# Patient Record
Sex: Female | Born: 1970
Health system: Southern US, Community
[De-identification: ages and names within clinical notes are randomized; demographics above are authoritative.]

## PROBLEM LIST (undated history)

## (undated) DIAGNOSIS — N200 Calculus of kidney: Secondary | ICD-10-CM

## (undated) DIAGNOSIS — J45909 Unspecified asthma, uncomplicated: Secondary | ICD-10-CM

## (undated) DIAGNOSIS — I10 Essential (primary) hypertension: Secondary | ICD-10-CM

## (undated) DIAGNOSIS — T7840XA Allergy, unspecified, initial encounter: Secondary | ICD-10-CM

## (undated) DIAGNOSIS — E119 Type 2 diabetes mellitus without complications: Secondary | ICD-10-CM

## (undated) HISTORY — PX: ABDOMINAL HYSTERECTOMY: SHX81

## (undated) HISTORY — DX: Allergy, unspecified, initial encounter: T78.40XA

## (undated) HISTORY — PX: APPENDECTOMY: SHX54

## (undated) HISTORY — PX: CHOLECYSTECTOMY: SHX55

## (undated) HISTORY — DX: Calculus of kidney: N20.0

## (undated) HISTORY — DX: Essential (primary) hypertension: I10

## (undated) HISTORY — DX: Unspecified asthma, uncomplicated: J45.909

## (undated) HISTORY — PX: EXPLORATORY LAPAROTOMY: SUR591

## (undated) HISTORY — DX: Type 2 diabetes mellitus without complications: E11.9

---

## 1999-05-31 HISTORY — PX: LAPAROSCOPY: SHX197

## 2009-01-24 ENCOUNTER — Emergency Department: Payer: Self-pay | Admitting: Emergency Medicine

## 2010-06-20 ENCOUNTER — Emergency Department: Payer: Self-pay | Admitting: Emergency Medicine

## 2011-06-30 LAB — HEPATIC FUNCTION PANEL A (ARMC)
Albumin: 3.9 g/dL (ref 3.4–5.0)
Alkaline Phosphatase: 73 U/L (ref 50–136)
Bilirubin, Direct: 0.1 mg/dL (ref 0.00–0.20)
Bilirubin,Total: 0.7 mg/dL (ref 0.2–1.0)
SGOT(AST): 25 U/L (ref 15–37)
SGPT (ALT): 24 U/L
Total Protein: 9.2 g/dL — ABNORMAL HIGH (ref 6.4–8.2)

## 2011-06-30 LAB — BASIC METABOLIC PANEL
Calcium, Total: 9.4 mg/dL (ref 8.5–10.1)
Chloride: 103 mmol/L (ref 98–107)
Co2: 24 mmol/L (ref 21–32)
Creatinine: 0.87 mg/dL (ref 0.60–1.30)
EGFR (African American): >60
EGFR (Non-African Amer.): >60
Glucose: 127 mg/dL — ABNORMAL HIGH (ref 65–99)
Osmolality: 279 (ref 275–301)
Potassium: 4.2 mmol/L (ref 3.5–5.1)
Sodium: 139 mmol/L (ref 136–145)

## 2011-06-30 LAB — URINALYSIS, COMPLETE
Bacteria: NONE SEEN
Bilirubin,UR: NEGATIVE
Blood: NEGATIVE
Glucose,UR: NEGATIVE mg/dL (ref 0–75)
Ketone: NEGATIVE
Leukocyte Esterase: NEGATIVE
RBC,UR: <1 /HPF (ref 0–5)
Squamous Epithelial: 1

## 2011-06-30 LAB — CBC
HCT: 43.6 % (ref 35.0–47.0)
MCH: 27.9 pg (ref 26.0–34.0)
MCHC: 33 g/dL (ref 32.0–36.0)
MCV: 85 fL (ref 80–100)
Platelet: 369 10*3/uL (ref 150–440)
WBC: 10.7 10*3/uL (ref 3.6–11.0)

## 2011-07-01 ENCOUNTER — Observation Stay: Payer: Self-pay | Admitting: Surgery

## 2011-07-01 LAB — CBC WITH DIFFERENTIAL/PLATELET
Basophil #: 0.1 10*3/uL (ref 0.0–0.1)
Eosinophil %: 0.3 %
HCT: 39.3 % (ref 35.0–47.0)
HGB: 12.9 g/dL (ref 12.0–16.0)
MCH: 27.7 pg (ref 26.0–34.0)
MCHC: 32.7 g/dL (ref 32.0–36.0)
MCV: 85 fL (ref 80–100)
Monocyte %: 5.9 %
Neutrophil #: 11 10*3/uL — ABNORMAL HIGH (ref 1.4–6.5)
Neutrophil %: 74.9 %
Platelet: 350 10*3/uL (ref 150–440)

## 2011-07-01 LAB — COMPREHENSIVE METABOLIC PANEL
Albumin: 3.5 g/dL (ref 3.4–5.0)
Alkaline Phosphatase: 65 U/L (ref 50–136)
BUN: 8 mg/dL (ref 7–18)
Bilirubin,Total: 0.7 mg/dL (ref 0.2–1.0)
Co2: 25 mmol/L (ref 21–32)
Creatinine: 0.76 mg/dL (ref 0.60–1.30)
EGFR (Non-African Amer.): >60
Osmolality: 275 (ref 275–301)
SGOT(AST): 19 U/L (ref 15–37)
SGPT (ALT): 21 U/L
Sodium: 138 mmol/L (ref 136–145)
Total Protein: 8 g/dL (ref 6.4–8.2)

## 2011-07-02 LAB — URINE CULTURE

## 2011-07-04 LAB — PATHOLOGY REPORT

## 2013-01-14 IMAGING — CT CT ABD-PELV W/ CM
1 of 2 series · 15 of 32 positions shown, 19 images · non-contrast
Comparison: none

REASON FOR EXAM: (1) RUQ and flank pain renal stone v gallbladder
disease; (2) ain renal stone v
COMMENTS:

PROCEDURE:     CT  - CT ABDOMEN / PELVIS  W  - June 30, 2011  [DATE]
RESULT:     CT abdomen and pelvis dated 06/30/2011. This study was compared
to a prior noncontrasted renal colic CT from 06/20/2010.
TECHNIQUE: Helical 3 mm sections were obtained from the lung bases through
the pubic symphysis status post intravenous administration of 100 mL of
3sovue-2IW.

[Series 2: 3mm soft tissue · axial · 0.83mm/px · z∈[-1065,-603]mm · 15 of 170 slices shown, 19 images]
[im 8/170  soft-tissue]
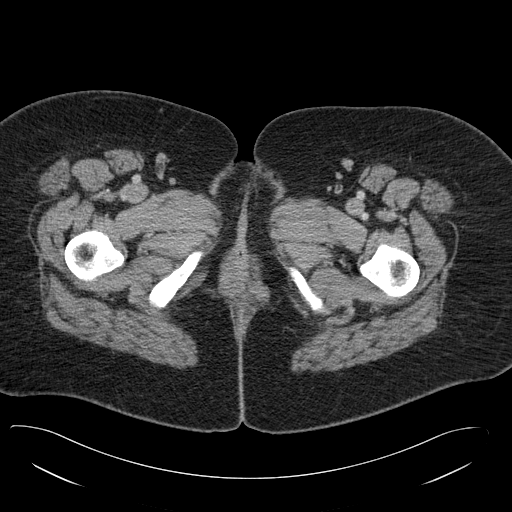
[im 8/170  bone]
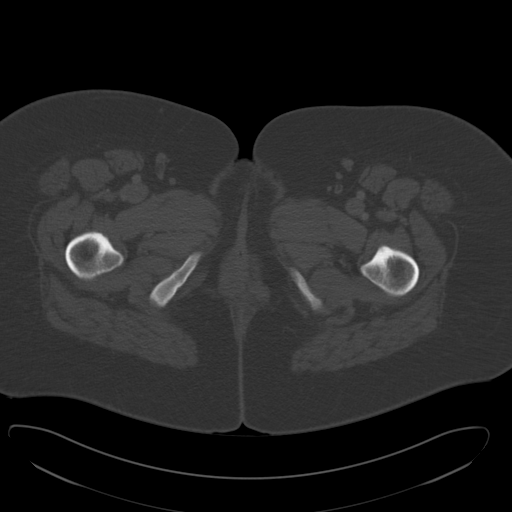
[im 22/170  soft-tissue]
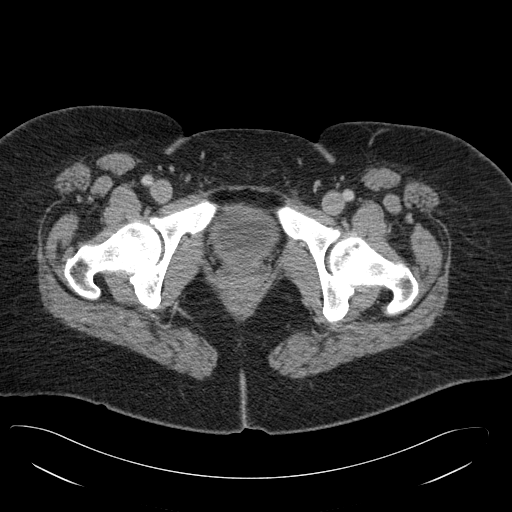
[im 36/170  soft-tissue]
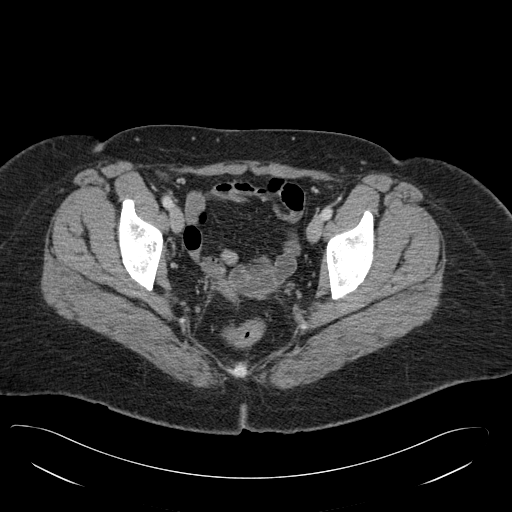
[im 50/170  soft-tissue]
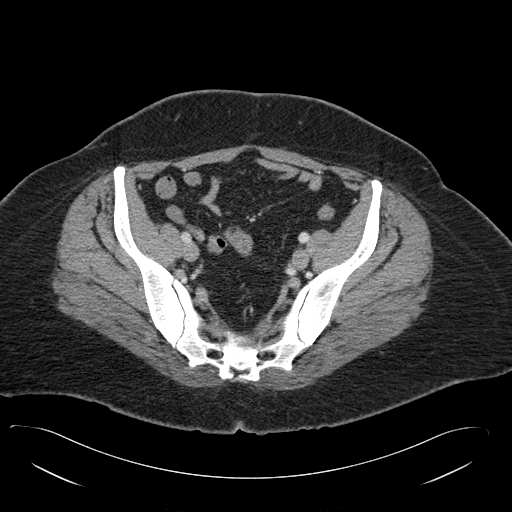
[im 57/170  soft-tissue]
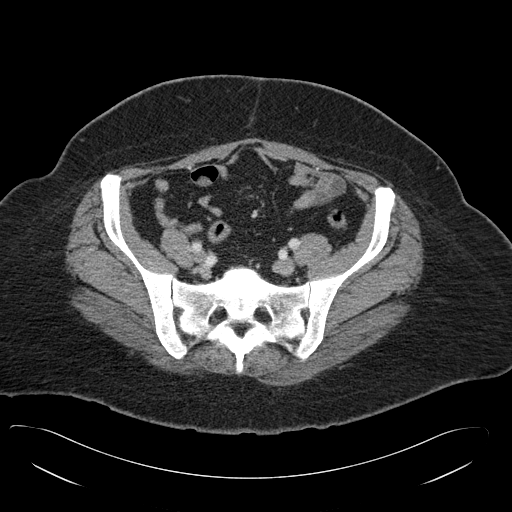
[im 71/170  soft-tissue]
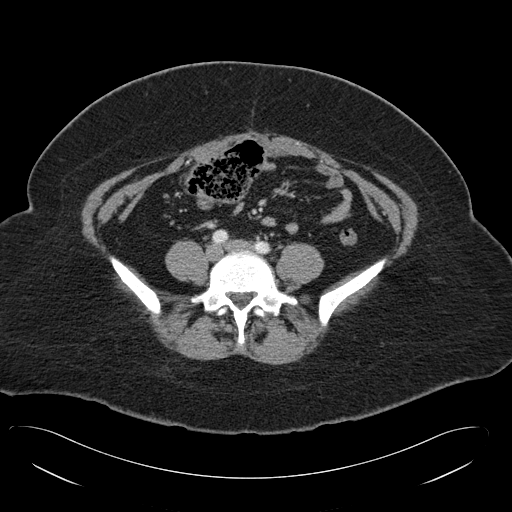
[im 85/170  soft-tissue]
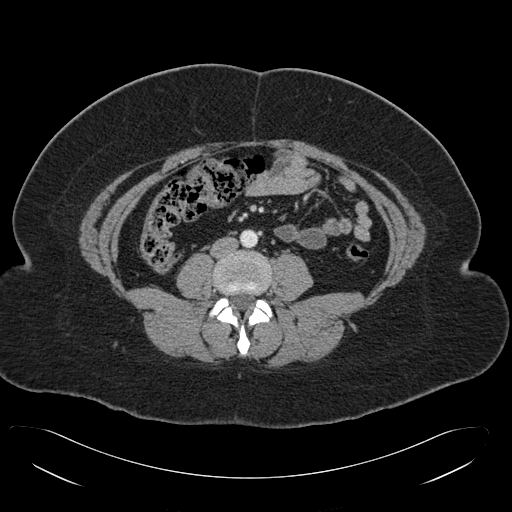
[im 99/170  soft-tissue]
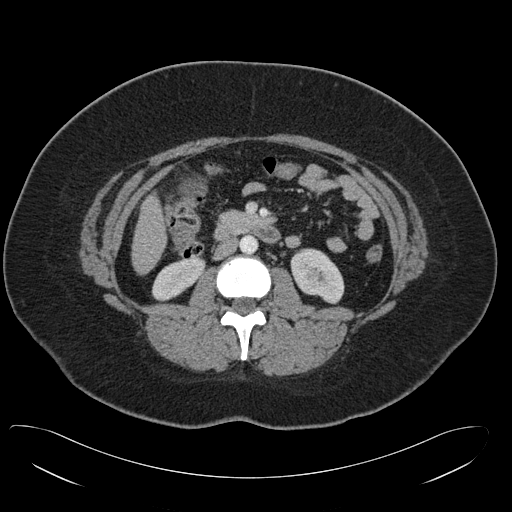
[im 113/170  soft-tissue]
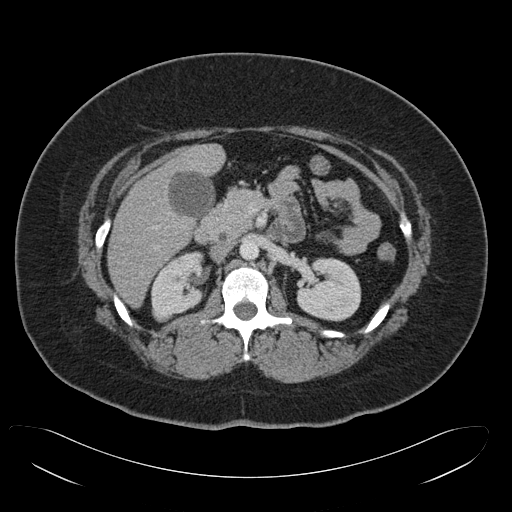
[im 113/170  bone]
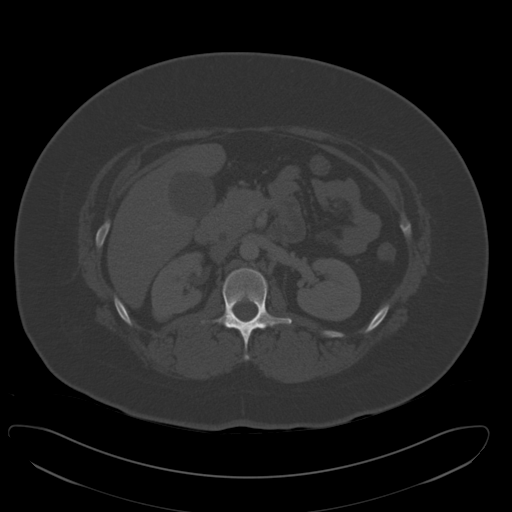
[im 120/170  soft-tissue]
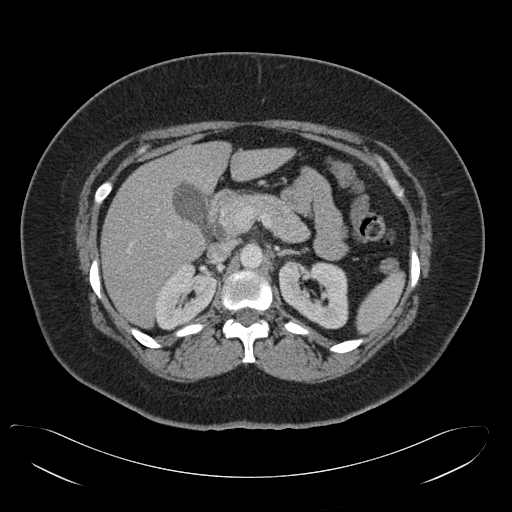
[im 134/170  soft-tissue]
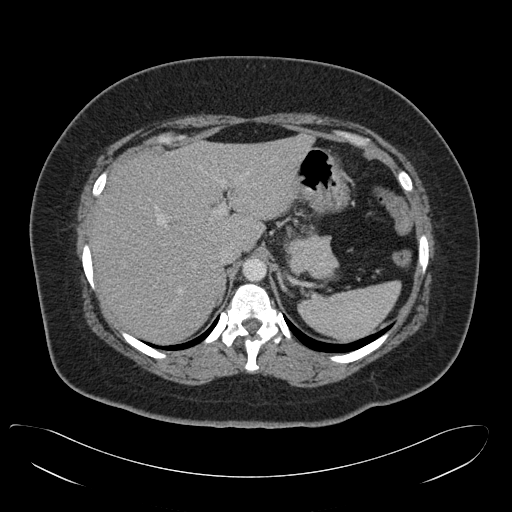
[im 141/170  lung]
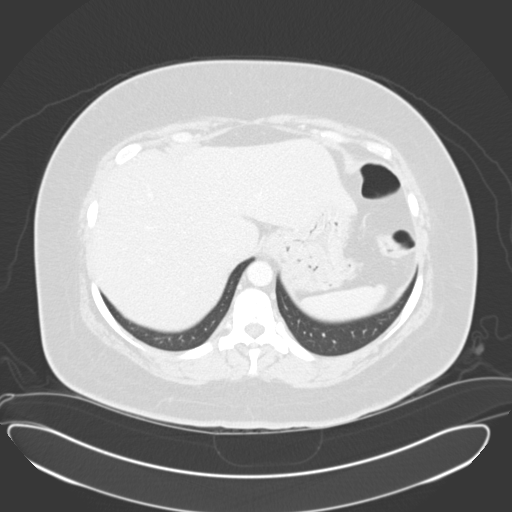
[im 148/170  soft-tissue]
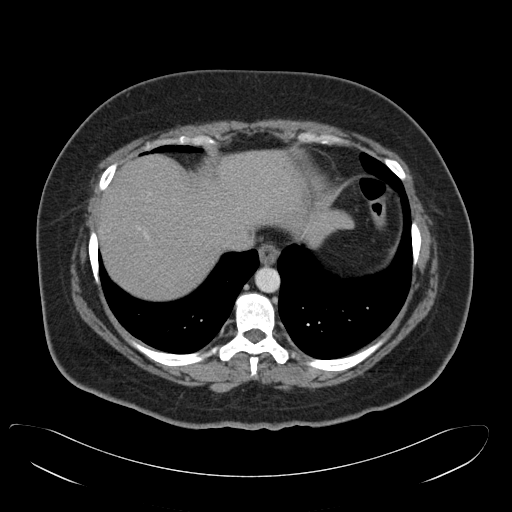
[im 148/170  lung]
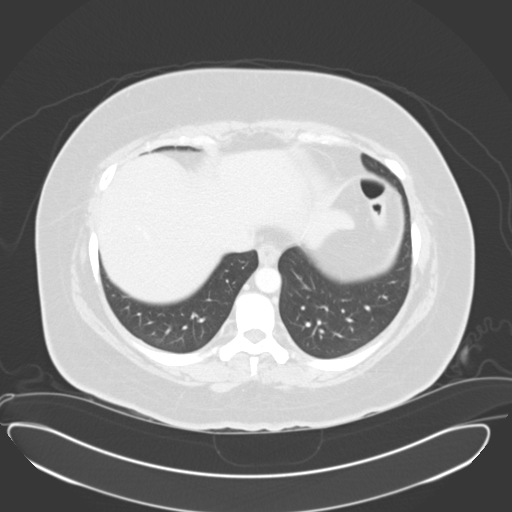
[im 155/170  lung]
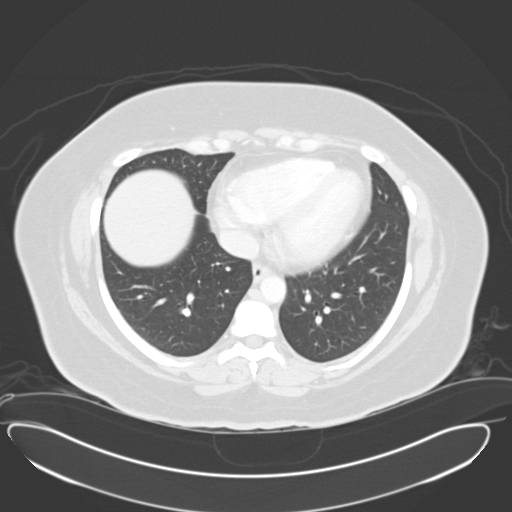
[im 162/170  soft-tissue]
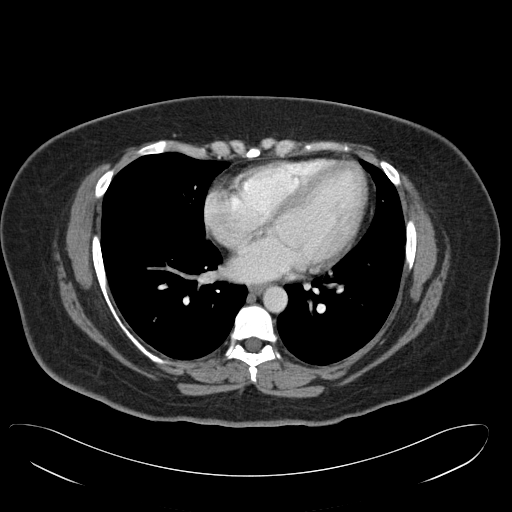
[im 162/170  lung]
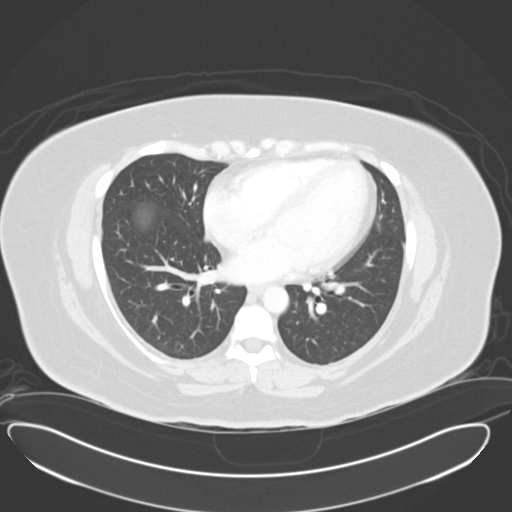

[15 of 32 positions shown; findings below may reference images not displayed]

FINDINGS: The lung bases are unremarkable.

The liver is unremarkable.

The gallbladder is distended with a small amount of surrounding
pericholecystic fluid. A trace amount of stranding is appreciated within the
pericholecystic fat along the base the gallbladder. No definite calcified
gallstones are appreciated. The common bile duct is dilated at 1.09 cm.

The pancreas, adrenals, right kidney unremarkable. 3.3 mm medullary calculus
is identified within the left kidney.

There is no CT evidence of bowel obstruction nor secondary signs reflecting
enteritis, colitis, diverticulitis nor appendicitis. There is no evidence of
abdominal or pelvic drainable loculated fluid collections masses or
adenopathy. There is no evidence of abdominal aortic aneurysm.
IMPRESSION: CT findings concerning for early or mild cholecystitis
clinical correlation recommended.
2. No further focal or acute abnormalities.

## 2014-09-21 NOTE — Discharge Summary (Signed)
PATIENT NAME:  Teresa SmilesFOUST, Liani D MR#:  [id number] DATE OF BIRTH:  09/14/1970  DATE OF ADMISSION:  06/30/2011 DATE OF DISCHARGE:  07/02/2011  BRIEF HISTORY: Marcelyn DittyLatanya Hulsey is a 44 year old woman seen in the Emergency Room with evidence consistent with acute biliary tract disease. She was admitted with 24-hour history of abdominal pain primarily midepigastric and right upper quadrant pain. Laboratory values were unremarkable. CT scan performed in the Emergency Room demonstrated cholecystitis findings and evidence of gallstones. There did not appear to be any evidence of other intraabdominal process. She was placed on IV antibiotics and reevaluated the following morning. Her white blood cell count had risen to 14,000 so we felt that surgical intervention was indicated. After appropriate preoperative preparation and informed consent, she was taken to surgery on the morning of 07/01/2011 where she underwent a laparoscopic cholecystectomy with cholangiography. The procedure was uncomplicated. She had no significant intraoperative problems, but she did have evidence of severe biliary tract disease with acute cholecystitis. She was observed over the course of the evening. This morning she is up, active, and tolerating a diet with no complaints. Her wounds look good. There is no sign of any infection. We will discharge her home today to be followed in the office in 7 to 10 days' time. She is to  take Vicodin for pain and resume her home medications which include ibuprofen 800 mg three times daily p.r.n.   FINAL DISCHARGE DIAGNOSIS: Acute cholecystitis.   PROCEDURE: Laparoscopic cholecystectomy with cholangiography. ____________________________ Carmie Endalph L. Ely III, MD rle:slb D: 07/02/2011 10:25:54 ET T: 07/03/2011 14:56:49 ET JOB#: [id number]  cc: Carmie Endalph L. Ely III, MD, <Dictator> Reginold AgentEugene H. Thurmond ButtsWade, MD Quentin OreALPH L ELY MD ELECTRONICALLY SIGNED 07/10/2011 21:14

## 2014-09-21 NOTE — Op Note (Signed)
PATIENT NAME:  Teresa SmilesFOUST, Ronnika D MR#:  [id number] DATE OF BIRTH:  1970-12-13  DATE OF PROCEDURE:  07/01/2011  PREOPERATIVE DIAGNOSIS: Acute cholecystitis.   POSTOPERATIVE DIAGNOSIS: Acute cholecystitis.   SURGERY: Laparoscopic cholecystectomy with cholangiography.   SURGEON: Quentin Orealph L. Ely, III, MD   ANESTHESIA: General.   OPERATIVE PROCEDURE: With the patient in the supine position after the induction of appropriate general anesthesia, the patient's abdomen was prepped with ChloraPrep and draped with sterile towels. The patient was placed in the head down, feet up position. A small infraumbilical incision was made in the standard fashion, carried down bluntly through the subcutaneous tissue. The Veress was used to cannulate the peritoneal cavity. CO2 was insufflated to appropriate pressure measurements. When approximately 2.5 liters of CO2 were instilled, the Veress needle was withdrawn. An 11 mm [employer] port was inserted into the peritoneal cavity. Intraperitoneal position was confirmed and CO2 was reinsufflated. The patient was placed in the head up, feet down position and rotated slightly to the left side. A subxiphoid transverse incision was made and an 11 mm port inserted under direct vision. Two lateral ports 5 mm in size were inserted under direct vision. The gallbladder was densely adherent to the omentum, and multiple adhesions were taken down over the gallbladder. It was quite distended and could not be grasped easily. It was drained of 45 mL of dark-colored bile. The omentum was taken off the gallbladder with difficulty, and the gallbladder appeared to be acutely inflamed. The gallbladder was retracted superiorly and laterally exposing the hepatoduodenal ligament. The cystic artery and cystic duct were identified. The cystic artery was doubly clipped and divided just above the lymph node. The cystic duct was easily visualized, clipped on the gallbladder side and opened. An on table  cholangiogram using dynamic fluoroscopy revealed free flow of dye into the duodenum. The hepatic radicles were seen. The duct was slightly dilated. The catheter was withdrawn, and the cystic duct was doubly clipped on the common duct side and divided. The gallbladder was then dissected free from its bed near the liver using hook and cautery apparatus. The gallbladder was quite edematous. Dissection was very difficult. Once the gallbladder was free, the camera was switched to the subxiphoid port and the umbilical port was reviewed. There did not appear to be any evidence of bowel or abdominal wall injury. No vascular injury was identified. An EndoCatch bag was used to capture the gallbladder, and it was removed through the umbilicus. The umbilical incision had to be slightly enlarged to accommodate the gallbladder. The abdomen was then copiously irrigated and suctioned. The abdomen was desufflated. All ports were withdrawn without difficulty. The midline fascia was closed with figure-of-eight sutures of 0 Vicryl. The skin was closed with 5-0 nylon. The area was infiltrated with 0.25% Marcaine for postoperative pain control. Sterile dressings were applied. The patient was returned to the recovery room having tolerated the procedure well. Sponge, instrument, and needle counts were correct x2 in the Operating Room.  ____________________________ Quentin Orealph L. Ely III, MD rle:cbb D: 07/01/2011 12:25:15 ET T: 07/01/2011 14:32:08 ET JOB#: [id number]  cc: Quentin Orealph L. Ely III, MD, <Dictator> Quentin OreALPH L ELY MD ELECTRONICALLY SIGNED 07/10/2011 21:13

## 2014-09-21 NOTE — H&P (Signed)
Subjective/Chief Complaint 14 hours of upper abdominal pain, nausea and vomiting.    History of Present Illness 44 y/o female with history of renal stones presents with 14 hours of constant upper abdominal pain with radiation into her upper middle back in a band-like distribution.  No fevers, no previous symptoms like this.  CT and US shows distended gallbladder and stones in GB neck, CBD about 10 mm.  no fevers, no jaundice.    Past History endometriosis, TAH/BSO Appendectomy Obesity   Past Med/Surgical Hx:  appendectomy:   hysterectomy:   ALLERGIES:  NKA: None    Other Allergies none   HOME MEDICATIONS:  ibuprofen 800 mg tablet: 1 tab(s) orally 3 times a day x 7 days as needed , Active  acetaminophen-HYDROcodone 500 mg-5 mg tablet: 2 tab(s) orally every 4 hours as needed  , Active  Family and Social History:   Family History Non-Contributory    Social History negative tobacco, negative ETOH, negative Illicit drugs    Place of Living Home  employed.   Review of Systems:   Subjective/Chief Complaint see above.    Abdominal Pain Yes    Nausea/Vomiting Yes    Tolerating Diet No  Nauseated  Vomiting    Medications/Allergies Reviewed Medications/Allergies reviewed   Physical Exam:   GEN NAD, obese, disheveled, temp 98.4 p 78 bp 208/118    HEENT pale conjunctivae    NECK No masses    CARD regular rate  no murmur    ABD positive tenderness  no hernia  midline scar, no hernia, positive rovsings's sign.    LYMPH negative neck    EXTR negative cyanosis/clubbing    SKIN normal to palpation, No rashes, No ulcers    NEURO cranial nerves intact    PSYCH A+O to time, place, person   Routine Chem:  31-Jan-13 14:19    Glucose, Serum 127   BUN 12   Creatinine (comp) 0.87   Sodium, Serum 139   Potassium, Serum 4.2   Chloride, Serum 103   CO2, Serum 24   Calcium (Total), Serum 9.4   Anion Gap 12   Osmolality (calc) 279   eGFR (African American) >60   eGFR  (Non-African American) >60  Routine Hem:  31-Jan-13 14:19    WBC (CBC) 10.7   RBC (CBC) 5.16   Hemoglobin (CBC) 14.4   Hematocrit (CBC) 43.6   Platelet Count (CBC) 369   MCV 85   MCH 27.9   MCHC 33.0   RDW 13.4  Hepatic:  31-Jan-13 14:19    Bilirubin, Total 0.7   Bilirubin, Direct 0.1   Alkaline Phosphatase 73   SGPT (ALT) 24   SGOT (AST) 25   Total Protein, Serum 9.2   Albumin, Serum 3.9  Routine UA:  31-Jan-13 14:20    Color (UA) Yellow   Clarity (UA) Clear   Glucose (UA) Negative   Bilirubin (UA) Negative   Ketones (UA) Negative   Specific Gravity (UA) 1.019   Blood (UA) Negative   pH (UA) 7.0   Protein (UA) Negative   Nitrite (UA) Negative   Leukocyte Esterase (UA) Negative   RBC (UA) <1 /HPF   WBC (UA) <1 /HPF   Epithelial Cells (UA) 1 /HPF   Mucous (UA) PRESENT   Radiology Results: [place]:    31-Jan-13 18:14, US Abdomen Limited Survey   US Abdomen Limited Survey   REASON FOR EXAM:    RUQ pain; eval gallbladder  COMMENTS:   Body Site: Appendix/Bowel  PROCEDURE: [place]  - US ABDOMEN LIMITED SURVEY  - Jun 30 2011  6:14PM     RESULT: Right upper quadrant ultrasound dated 06/30/2011.    Technique: Real-time sonographic imaging of the right upper quadrant was   obtained. 27 images were provided.    Findings: Evaluation of the gallbladder demonstrates 2 calculi within the   gallbladder. The largest is nonmobile appears be lodged within the neck   of the gallbladder measuring 3.06 cm in diameter. A second calculus is   appreciated measuring 2.06 cm in diameter. The patient is not demonstrate   a sonographic Murphy's sign. Is no evidence of gallbladder wall     thickening at 1.4 mm. No sonographic evidence of pericholecystic fluid is   identified. The common bile duct is dilated at 9 mm. The pancreatic head   is most unremarkable.    IMPRESSION:  Gallstone appears be lodged within the neck of the   gallbladder. There is dilatation of the common bile duct. No  further   sonographic evidence of cholecystitis. Clinical correlation recommended.          Verified By: Mikki Santee, M.D., MD  CT:    31-Jan-13 17:09, CT Abdomen and Pelvis With Contrast   CT Abdomen and Pelvis With Contrast   REASON FOR EXAM:    (1) RUQ and flank pain renal stone v gallbladder   disease; (2) ain renal stone v  COMMENTS:       PROCEDURE: CT  - CT ABDOMEN / PELVIS  W  - Jun 30 2011  5:09PM     RESULT: CT abdomen and pelvis dated 06/30/2011. This study was compared to   a prior noncontrasted renal colic CT from 4/94/4967.    Technique: Helical 3 mm sections were obtained from the lung bases   through the pubic symphysis status post intravenous administration of 100   mL of Isovue-370.    Findings: The lung bases are unremarkable.    The liver is unremarkable.    The gallbladder is distended with a small amount of surrounding   pericholecystic fluid. A trace amount of stranding is appreciated within   the pericholecystic fat along the base the gallbladder. No definite   calcified gallstones are appreciated. The common bile duct is dilated at   1.09 cm.    The pancreas, adrenals, right kidney unremarkable. 3.3 mm medullary   calculus is identified within the left kidney.    There is no CT evidence of bowel obstruction nor secondary signs   reflecting enteritis, colitis, diverticulitis nor appendicitis. There is   no evidence of abdominal or pelvic drainable loculated fluid collections   masses or adenopathy. There is no evidence of abdominal aortic aneurysm.  IMPRESSION:  CT findings concerning for early or mild cholecystitis   clinical correlation recommended.  2. No further focal or acute abnormalities.          Verified By: Mikki Santee, M.D., MD     Assessment/Admission Diagnosis 44 y/o female with acute calculus cholecystitis, obesity, ? undiagnosed hypertension.?    Plan Admit, IV fluids, pain meds, anti-emetics and plan for laparoscopic  cholecsytectomy with cholangiogram in am.  discussed procedure with patient, including risk of bleeding, bile duct injury, need for open operation.  Hypertension will recheck BP once pain better controlled.  total time spent 45 minutes  I personally reviewed CT scan and ultrasound on PACS monitor.   Electronic Signatures: Sherri Rad (MD)  (Signed 31-Jan-13 22:01)  Authored: CHIEF  COMPLAINT and HISTORY, PAST MEDICAL/SURGIAL HISTORY, ALLERGIES, Other Allergies, HOME MEDICATIONS, FAMILY AND SOCIAL HISTORY, REVIEW OF SYSTEMS, PHYSICAL EXAM, LABS, Radiology, ASSESSMENT AND PLAN   Last Updated: 31-Jan-13 22:01 by Sherri Rad (MD)

## 2014-09-21 NOTE — H&P (Signed)
PATIENT NAME:  Teresa SmilesFOUST, Leasia D MR#:  [id number] DATE OF BIRTH:  16-Dec-1970  DATE OF ADMISSION:  06/30/2011  ADMITTING DIAGNOSIS: Acute calculus cholecystitis.   HISTORY: This is a 44 year old white female with a remote history of significant endometriosis requiring laparotomy and total abdominal hysterectomy and bilateral salpingo-oophorectomy at the age of 44 who presented to the Emergency Room with a 14 hour history of sudden onset of epigastric and right upper quadrant abdominal pain associated in a bandlike orientation and distribution in her upper abdomen with extension into her mid back. The patient has had nausea and vomiting. No fevers, no previous symptoms of such. She does have a history of kidney stones, which she has passed in the past and she states that this pain is separate from that. Of note, she has also had an appendectomy. She has had no pregnancies. The patient had some narcotics while in the Emergency Room and feels somewhat better. She is hesitant to eat. Denies any family history of cholelithiasis. Again no previous symptoms of such. She has had no fever and no jaundice. While in the Emergency Room, a CT scan was obtained which demonstrated some stranding in the right upper quadrant around the area of the gallbladder. The common bile duct was dilated at greater than 10 mm. An ultrasound was obtained which demonstrated two large stones, one of which was lodged in the neck of the gallbladder and bile duct measuring greater than 10 mm. Surgical services were consulted.  ALLERGIES: None.   MEDICATIONS: None.   PAST MEDICAL HISTORY: Endometriosis.   PAST SURGICAL HISTORY: Total abdominal hysterectomy with bilateral salpingo-oophorectomy and adhesiolysis at the age of 44, appendectomy.   FAMILY HISTORY: Noncontributory.   SOCIAL HISTORY: She is employed and not married. Does not drink. Does not smoke. No illicit drugs. She lives at her home and is employed by an [employer].    REVIEW OF SYSTEMS: As described above and as per ten-point review, otherwise unremarkable.   PHYSICAL EXAMINATION:  VITAL SIGNS: Temperature 98.4, pulse 78, blood pressure is 208/18. She is obese in no apparent distress but somewhat disheveled.   LUNGS: Clear bilaterally.   HEART: Regular rate and rhythm.   ABDOMEN: Tender in the right upper quadrant and mid epigastrium with Murphy's sign present. There is a lower midline scar with no obvious hernias.   HEENT: Facies are symmetrical.   NEUROLOGIC: Grossly intact.   PSYCHIATRIC: Appropriate mood, judgment, and affect.   LABORATORY VALUES: Liver function tests are normal. Glucose 127, BUN 12, creatinine 0.87, sodium 139, potassium 4.2, total bilirubin 0.7, alkaline phosphatase 73, ALT 24, AST 25, serum albumin 3.2, white count 10.7, hemoglobin 14.4, hematocrit 43.6, platelet count 369,000.   Review of CT scan and ultrasound is as described above.   IMPRESSION: Acute calculus cholecystitis and dilated common bile duct. There is no biochemical signs of choledocholithiasis.   PLAN: The patient will be admitted, hydrated, administered narcotics and antiemetics. We will plan for laparoscopic cholecystectomy with intraoperative cholangiography in the morning. I discussed with her the risks of surgery including that of bleeding, infection, need for conversion to open  operation, bile duct injury, and possibility of needing future ERCP. All of her questions were answered.    TOTAL TIME SPENT: 60 minutes.   ____________________________ Redge GainerMark A. Egbert GaribaldiBird, MD mab:rbg D: 06/30/2011 23:46:50 ET T: 07/01/2011 08:31:12 ET JOB#: [id number]  cc: Loraine LericheMark A. Egbert GaribaldiBird, MD, <Dictator> Reginold AgentEugene H. Thurmond ButtsWade, MD Chelesa Weingartner Kela MillinA Metztli Sachdev MD ELECTRONICALLY SIGNED 07/01/2011 11:22

## 2015-02-25 ENCOUNTER — Emergency Department: Payer: Self-pay

## 2015-02-25 ENCOUNTER — Encounter: Payer: Self-pay | Admitting: *Deleted

## 2015-02-25 ENCOUNTER — Emergency Department
Admission: EM | Admit: 2015-02-25 | Discharge: 2015-02-25 | Disposition: A | Discharge status: Home / Self Care | Payer: Self-pay | Attending: Emergency Medicine | Admitting: Emergency Medicine

## 2015-02-25 DIAGNOSIS — J069 Acute upper respiratory infection, unspecified: Secondary | ICD-10-CM | POA: Insufficient documentation

## 2015-02-25 MED ORDER — IPRATROPIUM-ALBUTEROL 0.5-2.5 (3) MG/3ML IN SOLN
3.0000 mL | Freq: Once | RESPIRATORY_TRACT | Status: AC
  Administered 2015-02-25: 3 mL via RESPIRATORY_TRACT

## 2015-02-25 MED ORDER — AZITHROMYCIN 250 MG PO TABS: 500.0000 mg | ORAL_TABLET | Freq: Every day | ORAL | Status: AC

## 2015-02-25 MED ORDER — BENZONATATE 100 MG PO CAPS
200.0000 mg | ORAL_CAPSULE | Freq: Once | ORAL | Status: AC
  Administered 2015-02-25: 200 mg via ORAL
  Filled 2015-02-25: qty 2

## 2015-02-25 MED ORDER — BENZONATATE 100 MG PO CAPS: 100.0000 mg | ORAL_CAPSULE | Freq: Four times a day (QID) | ORAL | Status: AC | PRN

## 2015-02-25 MED ORDER — AZITHROMYCIN 250 MG PO TABS
500.0000 mg | ORAL_TABLET | Freq: Once | ORAL | Status: AC
  Administered 2015-02-25: 500 mg via ORAL
  Filled 2015-02-25: qty 2

## 2015-02-25 MED ORDER — IPRATROPIUM-ALBUTEROL 0.5-2.5 (3) MG/3ML IN SOLN
RESPIRATORY_TRACT | Status: AC
  Filled 2015-02-25: qty 3

## 2015-02-25 NOTE — ED Notes (Signed)
Breathing treatment given in triage.  Pt states she is breathing better.

## 2015-02-25 NOTE — ED Provider Notes (Signed)
[facility] Emergency Department Provider Note  ____________________________________________  Time seen: 5:10 AM  I have reviewed the triage vital signs and the nursing notes.   HISTORY  Chief Complaint Cough and Nasal Congestion      HPI Teresa Simpson is a 44 y.o. female presents with nasal congestion cough 2 days. Patient states she took over-the-counter Benadryl without relief. Patient denies any fever. Patient admits to previous episodes of sinusitis however denies any history of pneumonia.     Past medical history Sinusitis There are no active problems to display for this patient.   Past surgical history None No current outpatient prescriptions on file.  Allergies No known drug allergies No family history on file.  Social History Social History  Substance Use Topics  . Smoking status: Never Smoker   . Smokeless tobacco: Not on file  . Alcohol Use: No    Review of Systems  Constitutional: Negative for fever. Eyes: Negative for visual changes. ENT: Negative for sore throat. Positive for nasal congestion Cardiovascular: Negative for chest pain. Respiratory: Negative for shortness of breath. Positive for cough Gastrointestinal: Negative for abdominal pain, vomiting and diarrhea. Genitourinary: Negative for dysuria. Musculoskeletal: Negative for back pain. Skin: Negative for rash. Neurological: Negative for headaches, focal weakness or numbness.   10-point ROS otherwise negative.  ____________________________________________   PHYSICAL EXAM:  VITAL SIGNS: ED Triage Vitals  Enc Vitals Group     BP 02/25/15 0123 139/113 mmHg     Pulse Rate 02/25/15 0123 120     Resp 02/25/15 0123 22     Temp 02/25/15 0123 98.7 F (37.1 C)     Temp Source 02/25/15 0123 Oral     SpO2 02/25/15 0123 99 %     Weight 02/25/15 0123 285 lb (129.275 kg)     Height 02/25/15 0123  (1.778 m)     Head Cir --      Peak Flow --      Pain  Score --      Pain Loc --      Pain Edu? --      Excl. in GC? --      Constitutional: Alert and oriented. Well appearing and in no distress. Eyes: Conjunctivae are normal. PERRL. Normal extraocular movements. ENT   Head: Normocephalic and atraumatic.   Nose: No congestion/rhinnorhea.   Mouth/Throat: Mucous membranes are moist.   Neck: No stridor. Cardiovascular: Normal rate, regular rhythm. Normal and symmetric distal pulses are present in all extremities. No murmurs, rubs, or gallops. Respiratory: Normal respiratory effort without tachypnea nor retractions. Breath sounds are clear and equal bilaterally. No wheezes/rales/rhonchi. Gastrointestinal: Soft and nontender. No distention. There is no CVA tenderness. Genitourinary: deferred Musculoskeletal: Nontender with normal range of motion in all extremities. No joint effusions.  No lower extremity tenderness nor edema. Neurologic:  Normal speech and language. No gross focal neurologic deficits are appreciated. Speech is normal.  Skin:  Skin is warm, dry and intact. No rash noted. Psychiatric: Mood and affect are normal. Speech and behavior are normal. Patient exhibits appropriate insight and judgment.  ____________________________________________    LABS (pertinent positives/negatives)   RADIOLOGY   DG Chest 2 View (Final result) Result time: 02/25/15 05:52:06   Final result by Rad Results In Interface (02/25/15 05:52:06)   Narrative:   CLINICAL DATA: Initial evaluation for acute cough, fever.  EXAM: CHEST 2 VIEW  COMPARISON: None.  FINDINGS: Transverse heart size at the upper limits of normal. Mediastinal silhouette within normal  limits.  Lungs are hypoinflated. Minimal linear opacity along the left hemidiaphragm most consistent with subsegmental atelectasis. No focal infiltrates identified no pulmonary edema or pleural effusion. No pneumothorax.  No acute osseus abnormality.  IMPRESSION: Shallow  lung inflation with mild left basilar subsegmental atelectasis. No other active cardiopulmonary disease.   Electronically Signed By: Rise Mu M.D. On: 02/25/2015 05:52      INITIAL IMPRESSION / ASSESSMENT AND PLAN / ED COURSE  Pertinent labs & imaging results that were available during my care of the patient were reviewed by me and considered in my medical decision making (see chart for details).  History physical exam consistent with upper respiratory tract infection.  ____________________________________________   FINAL CLINICAL IMPRESSION(S) / ED DIAGNOSES  Final diagnoses:  Upper respiratory tract infection      Darci Current, MD 02/25/15 [phone]

## 2015-02-25 NOTE — Discharge Instructions (Signed)
Upper Respiratory Infection, Adult An upper respiratory infection (URI) is also sometimes known as the common cold. The upper respiratory tract includes the nose, sinuses, throat, trachea, and bronchi. Bronchi are the airways leading to the lungs. Most people improve within 1 week, but symptoms can last up to 2 weeks. A residual cough may last even longer.  CAUSES Many different viruses can infect the tissues lining the upper respiratory tract. The tissues become irritated and inflamed and often become very moist. Mucus production is also common. A cold is contagious. You can easily spread the virus to others by oral contact. This includes kissing, sharing a glass, coughing, or sneezing. Touching your mouth or nose and then touching a surface, which is then touched by another person, can also spread the virus. SYMPTOMS  Symptoms typically develop 1 to 3 days after you come in contact with a cold virus. Symptoms vary from person to person. They may include:  Runny nose.  Sneezing.  Nasal congestion.  Sinus irritation.  Sore throat.  Loss of voice (laryngitis).  Cough.  Fatigue.  Muscle aches.  Loss of appetite.  Headache.  Low-grade fever. DIAGNOSIS  You might diagnose your own cold based on familiar symptoms, since most people get a cold 2 to 3 times a year. Your caregiver can confirm this based on your exam. Most importantly, your caregiver can check that your symptoms are not due to another disease such as strep throat, sinusitis, pneumonia, asthma, or epiglottitis. Blood tests, throat tests, and X-rays are not necessary to diagnose a common cold, but they may sometimes be helpful in excluding other more serious diseases. Your caregiver will decide if any further tests are required. RISKS AND COMPLICATIONS  You may be at risk for a more severe case of the common cold if you smoke cigarettes, have chronic heart disease (such as heart failure) or lung disease (such as asthma), or if  you have a weakened immune system. The very young and very old are also at risk for more serious infections. Bacterial sinusitis, middle ear infections, and bacterial pneumonia can complicate the common cold. The common cold can worsen asthma and chronic obstructive pulmonary disease (COPD). Sometimes, these complications can require emergency medical care and may be life-threatening. PREVENTION  The best way to protect against getting a cold is to practice good hygiene. Avoid oral or hand contact with people with cold symptoms. Wash your hands often if contact occurs. There is no clear evidence that vitamin C, vitamin E, echinacea, or exercise reduces the chance of developing a cold. However, it is always recommended to get plenty of rest and practice good nutrition. TREATMENT  Treatment is directed at relieving symptoms. There is no cure. Antibiotics are not effective, because the infection is caused by a virus, not by bacteria. Treatment may include:  Increased fluid intake. Sports drinks offer valuable electrolytes, sugars, and fluids.  Breathing heated mist or steam (vaporizer or shower).  Eating chicken soup or other clear broths, and maintaining good nutrition.  Getting plenty of rest.  Using gargles or lozenges for comfort.  Controlling fevers with ibuprofen or acetaminophen as directed by your caregiver.  Increasing usage of your inhaler if you have asthma. Zinc gel and zinc lozenges, taken in the first 24 hours of the common cold, can shorten the duration and lessen the severity of symptoms. Pain medicines may help with fever, muscle aches, and throat pain. A variety of non-prescription medicines are available to treat congestion and runny nose. Your caregiver   can make recommendations and may suggest nasal or lung inhalers for other symptoms.  HOME CARE INSTRUCTIONS   Only take over-the-counter or prescription medicines for pain, discomfort, or fever as directed by your  caregiver.  Use a warm mist humidifier or inhale steam from a shower to increase air moisture. This may keep secretions moist and make it easier to breathe.  Drink enough water and fluids to keep your urine clear or pale yellow.  Rest as needed.  Return to work when your temperature has returned to normal or as your caregiver advises. You may need to stay home longer to avoid infecting others. You can also use a face mask and careful hand washing to prevent spread of the virus. SEEK MEDICAL CARE IF:   After the first few days, you feel you are getting worse rather than better.  You need your caregiver's advice about medicines to control symptoms.  You develop chills, worsening shortness of breath, or Teresa Simpson or red sputum. These may be signs of pneumonia.  You develop yellow or Teresa Simpson nasal discharge or pain in the face, especially when you bend forward. These may be signs of sinusitis.  You develop a fever, swollen neck glands, pain with swallowing, or white areas in the back of your throat. These may be signs of strep throat. SEEK IMMEDIATE MEDICAL CARE IF:   You have a fever.  You develop severe or persistent headache, ear pain, sinus pain, or chest pain.  You develop wheezing, a prolonged cough, cough up blood, or have a change in your usual mucus (if you have chronic lung disease).  You develop sore muscles or a stiff neck. Document Released: 11/09/2000 Document Revised: 08/08/2011 Document Reviewed: 08/21/2013 ExitCare Patient Information 2015 [employer]. This information is not intended to replace advice given to you by your health care provider. Make sure you discuss any questions you have with your health care provider.  

## 2015-02-25 NOTE — ED Notes (Signed)
Pt rechecked in triage  States getting harder to breathe.

## 2015-02-25 NOTE — ED Notes (Signed)
Pt has sinus congestion and cough for 2 days.  Pt took some benadryl without relief.  Nonsmoker.

## 2015-02-25 NOTE — ED Notes (Signed)
MD at bedside. 

## 2016-04-05 ENCOUNTER — Emergency Department
Admission: EM | Admit: 2016-04-05 | Discharge: 2016-04-05 | Disposition: A | Discharge status: Home / Self Care | Payer: BLUE CROSS/BLUE SHIELD | Attending: Emergency Medicine | Admitting: Emergency Medicine

## 2016-04-05 DIAGNOSIS — E1165 Type 2 diabetes mellitus with hyperglycemia: Secondary | ICD-10-CM | POA: Diagnosis not present

## 2016-04-05 DIAGNOSIS — J45909 Unspecified asthma, uncomplicated: Secondary | ICD-10-CM | POA: Insufficient documentation

## 2016-04-05 DIAGNOSIS — R079 Chest pain, unspecified: Secondary | ICD-10-CM | POA: Diagnosis not present

## 2016-04-05 DIAGNOSIS — R739 Hyperglycemia, unspecified: Secondary | ICD-10-CM

## 2016-04-05 DIAGNOSIS — R42 Dizziness and giddiness: Secondary | ICD-10-CM | POA: Diagnosis not present

## 2016-04-05 DIAGNOSIS — E871 Hypo-osmolality and hyponatremia: Secondary | ICD-10-CM | POA: Diagnosis not present

## 2016-04-05 LAB — BASIC METABOLIC PANEL
Anion gap: 7 (ref 5–15)
BUN: 8 mg/dL (ref 6–20)
CHLORIDE: 99 mmol/L — AB (ref 101–111)
CO2: 26 mmol/L (ref 22–32)
Calcium: 9.1 mg/dL (ref 8.9–10.3)
Creatinine, Ser: 0.78 mg/dL (ref 0.44–1.00)
GFR calc non Af Amer: >60 mL/min (ref >60)
Glucose, Bld: 395 mg/dL — ABNORMAL HIGH (ref 65–99)
POTASSIUM: 4 mmol/L (ref 3.5–5.1)
SODIUM: 132 mmol/L — AB (ref 135–145)

## 2016-04-05 LAB — PREGNANCY, URINE: Preg Test, Ur: NEGATIVE

## 2016-04-05 LAB — HEPATIC FUNCTION PANEL
ALBUMIN: 3.8 g/dL (ref 3.5–5.0)
ALT: 42 U/L (ref 14–54)
AST: 37 U/L (ref 15–41)
Alkaline Phosphatase: 88 U/L (ref 38–126)
BILIRUBIN INDIRECT: 0.5 mg/dL (ref 0.3–0.9)
Bilirubin, Direct: 0.1 mg/dL (ref 0.1–0.5)
TOTAL PROTEIN: 8.3 g/dL — AB (ref 6.5–8.1)
Total Bilirubin: 0.6 mg/dL (ref 0.3–1.2)

## 2016-04-05 LAB — URINALYSIS COMPLETE WITH MICROSCOPIC (ARMC ONLY)
BACTERIA UA: NONE SEEN
Bilirubin Urine: NEGATIVE
Glucose, UA: >500 mg/dL — AB
Hgb urine dipstick: NEGATIVE
Ketones, ur: NEGATIVE mg/dL
LEUKOCYTES UA: NEGATIVE
Nitrite: NEGATIVE
PH: 6 (ref 5.0–8.0)
PROTEIN: NEGATIVE mg/dL
Specific Gravity, Urine: 1.029 (ref 1.005–1.030)

## 2016-04-05 LAB — CBC
HEMATOCRIT: 44.9 % (ref 35.0–47.0)
HEMOGLOBIN: 14.6 g/dL (ref 12.0–16.0)
MCH: 28.8 pg (ref 26.0–34.0)
MCHC: 32.6 g/dL (ref 32.0–36.0)
MCV: 88.4 fL (ref 80.0–100.0)
Platelets: 295 10*3/uL (ref 150–440)
RBC: 5.07 MIL/uL (ref 3.80–5.20)
RDW: 13 % (ref 11.5–14.5)
WBC: 8.7 10*3/uL (ref 3.6–11.0)

## 2016-04-05 LAB — TROPONIN I: Troponin I: <0.03 ng/mL (ref <0.03)

## 2016-04-05 LAB — GLUCOSE, CAPILLARY
GLUCOSE-CAPILLARY: 232 mg/dL — AB (ref 65–99)
GLUCOSE-CAPILLARY: 260 mg/dL — AB (ref 65–99)
GLUCOSE-CAPILLARY: 161 mg/dL — AB (ref 65–99)
Glucose-Capillary: 400 mg/dL — ABNORMAL HIGH (ref 65–99)

## 2016-04-05 MED ORDER — METFORMIN HCL 500 MG PO TABS: 500.0000 mg | ORAL_TABLET | Freq: Every day | ORAL | 0 refills | Status: DC

## 2016-04-05 MED ORDER — SODIUM CHLORIDE 0.9 % IV BOLUS (SEPSIS)
1000.0000 mL | Freq: Once | INTRAVENOUS | Status: AC
  Administered 2016-04-05: 1000 mL via INTRAVENOUS

## 2016-04-05 MED ORDER — INSULIN ASPART 100 UNIT/ML ~~LOC~~ SOLN
10.0000 [IU] | Freq: Once | SUBCUTANEOUS | Status: AC
  Administered 2016-04-05: 10 [IU] via INTRAVENOUS
  Filled 2016-04-05: qty 10

## 2016-04-05 NOTE — ED Triage Notes (Signed)
Pt states she was at work today had sudden onset chest pain with dizziness, states EMS came and checked on her, states [name], pt refused transport and drove herself here,... Denies chest pain at present, having some dizziness..denies Hx of diabetes..Teresa Simpson

## 2016-04-05 NOTE — Discharge Instructions (Signed)
Please drink plenty of fluid to stay well-hydrated. Please start taking metformin, and make an appointment with your primary care physician in the next 1-2 days.  Your doctor will talk to you about your high blood sugar, and may schedule you for a cardiac stress test for further evaluation of your chest pain.  Return to the emergency department if he developed chest pain, shortness of breath, lightheadedness or fainting, fever, abdominal pain, vomiting, or any other symptoms concerning to you.

## 2016-04-05 NOTE — ED Provider Notes (Addendum)
[facility] Emergency Department Provider Note  ____________________________________________  Time seen: Approximately 4:58 PM  I have reviewed the triage vital signs and the nursing notes.   HISTORY  Chief Complaint Chest Pain and Hyperglycemia    HPI Teresa Simpson is a 45 y.o. female with no known chronic medical conditions but has not seen a doctor in many years presenting with chest pain and hyperglycemia. The patient was at work talking to a client when she had an acute onset of a lightheaded sensation and feeling "out of it." She then developed a bandlike chest pain across the middle of her chest and into her back. No syncope, lightheadedness, palpitations, diaphoresis, nausea or vomiting. Upoon arrival EMS noted the patient's blood sugar to be in the 400s.The patient has never been diagnosed with diabetes in the past. At this time, the patient feels back to her normal baseline.  FH: + DM  History reviewed. No pertinent past medical history.  There are no active problems to display for this patient.   Past Surgical History:  Procedure Laterality Date  . ABDOMINAL HYSTERECTOMY    . APPENDECTOMY    . CHOLECYSTECTOMY        Allergies Patient has no known allergies.  No family history on file.  Social History Social History  Substance Use Topics  . Smoking status: Never Smoker  . Smokeless tobacco: Never Used  . Alcohol use No    Review of Systems Constitutional: No fever/chills. Eyes: No visual changes. ENT: No sore throat. No congestion or rhinorrhea. Cardiovascular: Positive chest pain. Denies palpitations. Respiratory: Denies shortness of breath.  No cough. Gastrointestinal: No abdominal pain.  No nausea, no vomiting.  No diarrhea.  No constipation. Genitourinary: Negative for dysuria. Musculoskeletal: Negative for back pain. Skin: Negative for rash. Neurological: Negative for headaches. No focal  numbness, tingling or  weakness.  Endocrine: Positive hyperglycemia 10-point ROS otherwise negative.  ____________________________________________   PHYSICAL EXAM:  VITAL SIGNS: ED Triage Vitals [04/05/16 1448]  Enc Vitals Group     BP (!) 159/102     Pulse Rate 93     Resp 18     Temp 97.9 F (36.6 C)     Temp Source Oral     SpO2 100 %     Weight 280 lb (127 kg)     Height 5\' 10"  (1.778 m)     Head Circumference      Peak Flow      Pain Score      Pain Loc      Pain Edu?      Excl. in GC?     Constitutional: Alert and oriented. Well appearing and in no acute distress. Answers questions appropriately. Eyes: Conjunctivae are normal.  EOMI. No scleral icterus. Head: Atraumatic. Nose: No congestion/rhinnorhea. Mouth/Throat: Mucous membranes are moist.  Neck: No stridor.  Supple.   Cardiovascular: Normal rate, regular rhythm. No murmurs, rubs or gallops.  Respiratory: Normal respiratory effort.  No accessory muscle use or retractions. Lungs CTAB.  No wheezes, rales or ronchi. Gastrointestinal: Obese. Soft, nontender and nondistended.  No guarding or rebound.  No peritoneal signs. Musculoskeletal: No LE edema. No ttp in the calves or palpable cords.  Negative Homan's sign. Neurologic:  A&Ox3.  Speech is clear.  Face and smile are symmetric.  EOMI.  Moves all extremities well. Skin:  Skin is warm, dry and intact. No rash noted. Psychiatric: Mood and affect are normal. Speech and behavior are normal.  Normal judgement.  ____________________________________________   LABS (all labs ordered are listed, but only abnormal results are displayed)  Labs Reviewed  GLUCOSE, CAPILLARY - Abnormal; Notable for the following:       Result Value   Glucose-Capillary 400 (*)    All other components within normal limits  BASIC METABOLIC PANEL - Abnormal; Notable for the following:    Sodium 132 (*)    Chloride 99 (*)    Glucose, Bld 395 (*)    All other components within normal limits  URINALYSIS  COMPLETEWITH MICROSCOPIC ([facility] ONLY) - Abnormal; Notable for the following:    Color, Urine STRAW (*)    APPearance CLEAR (*)    Glucose, UA >500 (*)    Squamous Epithelial / LPF 0-5 (*)    All other components within normal limits  HEPATIC FUNCTION PANEL - Abnormal; Notable for the following:    Total Protein 8.3 (*)    All other components within normal limits  GLUCOSE, CAPILLARY - Abnormal; Notable for the following:    Glucose-Capillary 260 (*)    All other components within normal limits  GLUCOSE, CAPILLARY - Abnormal; Notable for the following:    Glucose-Capillary 232 (*)    All other components within normal limits  GLUCOSE, CAPILLARY - Abnormal; Notable for the following:    Glucose-Capillary 161 (*)    All other components within normal limits  CBC  TROPONIN I  TROPONIN I  PREGNANCY, URINE  CBG MONITORING, ED   ____________________________________________  EKG  ED ECG REPORT I, Rockne MenghiniNorman, Anne-Caroline, the attending physician, personally viewed and interpreted this ECG.   Date: 04/05/2016  EKG Time: 1452  Rate: 85  Rhythm: normal sinus rhythm  Axis: leftward  Intervals:first-degree A-V block   ST&T Change: Nonspecific T-wave inversion in V1. No ST elevation  ____________________________________________  RADIOLOGY  No results found.  ____________________________________________   PROCEDURES  Procedure(s) performed: None  Procedures  Critical Care performed: No ____________________________________________   INITIAL IMPRESSION / ASSESSMENT AND PLAN / ED COURSE  Pertinent labs & imaging results that were available during my care of the patient were reviewed by me and considered in my medical decision making (see chart for details).  45 y.o. female with obesity and no known chronic medical illnesses presenting with chest pain and hyperglycemia. On examination, the patient is hemodynamically stable, has no acute cardiopulmonary findings. Her  laboratory studies show an initial blood sugar of 400 without DKA. She does have mild associated hyponatremia. After 1 L of fluid, her blood sugars now is in the 200s. She does not have urinary tract infection, and we will rule out pregnancy. The patient's first troponin is negative, will repeat one prior to discharge. The patient has are the made an appointment to establish a primary care physician for Monday. I have tried calling the clinic, and unfortunately they're closed. My plan if the remainder of the patient's workup in the emergency department is reassuring, as to initiate the patient on metformin and discharge her home with close PMD follow-up. She understands return precautions as well as follow-up instructions.  ----------------------------------------- 6:51 PM on 04/05/2016 -----------------------------------------  The patient remains hemodynamically stable and asymptomatic at this time. Her blood sugar has improved. Her troponin is negative 2. At this time, the patient is stable for discharge. She understands return precautions as well as follow-up instructions.  ____________________________________________  FINAL CLINICAL IMPRESSION(S) / ED DIAGNOSES  Final diagnoses:  Hyperglycemia  Chest pain, unspecified type  Lightheadedness  Hyponatremia    Clinical Course  NEW MEDICATIONS STARTED DURING THIS VISIT:  New Prescriptions   METFORMIN (GLUCOPHAGE) 500 MG TABLET    Take 1 tablet (500 mg total) by mouth daily with breakfast.      Rockne Menghini, MD 04/05/16 1706    Rockne Menghini, MD 04/05/16 1851

## 2016-04-06 ENCOUNTER — Encounter: Payer: Self-pay | Admitting: Family Medicine

## 2016-04-06 ENCOUNTER — Telehealth: Payer: Self-pay | Admitting: Family Medicine

## 2016-04-06 ENCOUNTER — Ambulatory Visit (INDEPENDENT_AMBULATORY_CARE_PROVIDER_SITE_OTHER): Payer: BLUE CROSS/BLUE SHIELD | Admitting: Family Medicine

## 2016-04-06 VITALS — BP 134/98 | HR 88 | Temp 98.4°F | Resp 16 | Ht 69.0 in | Wt 287.5 lb

## 2016-04-06 DIAGNOSIS — I10 Essential (primary) hypertension: Secondary | ICD-10-CM

## 2016-04-06 DIAGNOSIS — IMO0002 Reserved for concepts with insufficient information to code with codable children: Secondary | ICD-10-CM | POA: Insufficient documentation

## 2016-04-06 DIAGNOSIS — E1165 Type 2 diabetes mellitus with hyperglycemia: Secondary | ICD-10-CM | POA: Diagnosis not present

## 2016-04-06 DIAGNOSIS — IMO0001 Reserved for inherently not codable concepts without codable children: Secondary | ICD-10-CM

## 2016-04-06 LAB — LIPID PANEL
CHOLESTEROL: 164 mg/dL (ref 0–200)
HDL: 53.6 mg/dL (ref >39.00)
LDL Cholesterol: 93 mg/dL (ref 0–99)
NonHDL: 109.91
Total CHOL/HDL Ratio: 3
Triglycerides: 86 mg/dL (ref 0.0–149.0)
VLDL: 17.2 mg/dL (ref 0.0–40.0)

## 2016-04-06 LAB — MICROALBUMIN / CREATININE URINE RATIO
CREATININE, U: 163 mg/dL
MICROALB UR: 1.3 mg/dL (ref 0.0–1.9)
MICROALB/CREAT RATIO: 0.8 mg/g (ref 0.0–30.0)

## 2016-04-06 LAB — HEMOGLOBIN A1C: HEMOGLOBIN A1C: 12.8 % — AB (ref 4.6–6.5)

## 2016-04-06 MED ORDER — LISINOPRIL 5 MG PO TABS: 5.0000 mg | ORAL_TABLET | Freq: Every day | ORAL | 3 refills | Status: DC

## 2016-04-06 MED ORDER — METFORMIN HCL 500 MG PO TABS: 500.0000 mg | ORAL_TABLET | Freq: Two times a day (BID) | ORAL | 1 refills | Status: DC

## 2016-04-06 NOTE — Telephone Encounter (Signed)
A voicemail left telling pt to callback and schedule labs in 1 week. Please have pt scheduled when she calls back.

## 2016-04-06 NOTE — Progress Notes (Signed)
Pre visit review using our clinic review tool, if applicable. No additional management support is needed unless otherwise documented below in the visit note. 

## 2016-04-06 NOTE — Patient Instructions (Signed)
Take the medications as we discussed.  We will be in contact regarding you seeing a diabetes educator.  Follow up in 3 months.  Take care  Dr. Adriana Simasook

## 2016-04-06 NOTE — Progress Notes (Signed)
Subjective:  Patient ID: Teresa Simpson, female    DOB: 08/16/1970  Age: 45 y.o. MRN: [id number]  CC: DM-2  HPI Teresa Simpson is a 45 y.o. female presents to the clinic today as a new patient with the above complaint. She was recently found to be diabetic (ie new diagnosis).  DM-2  New diagnosis.  Patient seen in the ED yesterday and was found to be markedly hyperglycemic with a blood sugar of 400. She had associated chest pain. She had an unremarkable workup and was discharged on metformin with instructions to follow-up with the primary care physician.  She presents today to establish care. Her primary concern is her diabetes.  She has yet to start the metformin.  She endorses polyuria. She states that this is been going on for the past several months.  Preventative care  Eye exam - Had eye exam earlier this year.  Foot exam -  In need of.  Last A1C - In need of.  Urine microalbumin - In need of.  Elevated BP  Patient with elevated blood pressure today.  She has never been on treatment for hypertension.  We'll discuss this today.  PMH, Surgical Hx, Family Hx, Social History reviewed and updated as below.  Past Medical History:  Diagnosis Date  . Allergy   . Asthma   . Diabetes mellitus without complication (HCC)   . High blood pressure   . Kidney stones    Past Surgical History:  Procedure Laterality Date  . ABDOMINAL HYSTERECTOMY    . APPENDECTOMY    . CHOLECYSTECTOMY    . LAPAROSCOPY  2001   Family History  Problem Relation Age of Onset  . Alcohol abuse Father   . Diabetes Father   . Hypertension Father   . Breast cancer Maternal Aunt   . Breast cancer Paternal Aunt   . Diabetes Maternal Grandmother   . Colon cancer Maternal Grandmother    Social History  Substance Use Topics  . Smoking status: Never Smoker  . Smokeless tobacco: Never Used  . Alcohol use Yes   Review of Systems  Genitourinary: Positive for dysuria and frequency.    Neurological: Positive for dizziness.  Psychiatric/Behavioral:       Anxiety, stress.  All other systems reviewed and are negative.  Objective:   Today's Vitals: BP (!) 134/98 (BP Location: Right Arm, Patient Position: Sitting, Cuff Size: Large)   Pulse 88   Temp 98.4 F (36.9 C) (Oral)   Resp 16   Ht 5\' 9"  (1.753 m)   Wt 287 lb 8 oz (130.4 kg)   SpO2 96%   BMI 42.46 kg/m   Physical Exam  Constitutional: She is oriented to person, place, and time. She appears well-developed. No distress.  HENT:  Head: Normocephalic and atraumatic.  Mouth/Throat: Oropharynx is clear and moist.  Eyes: Conjunctivae are normal. No scleral icterus.  Neck: Neck supple.  Cardiovascular: Normal rate and regular rhythm.   Pulmonary/Chest: Effort normal. She has no wheezes. She has no rales.  Abdominal: Soft. She exhibits no distension. There is no tenderness. There is no rebound and no guarding.  Musculoskeletal: Normal range of motion. She exhibits no edema.  Lymphadenopathy:    She has no cervical adenopathy.  Neurological: She is alert and oriented to person, place, and time.  Skin: Skin is warm and dry. No rash noted.  Psychiatric: She has a normal mood and affect.  Vitals reviewed.  Assessment & Plan:   Problem List Items  Addressed This Visit    Uncontrolled type 2 diabetes mellitus without complication, without long-term current use of insulin (HCC) - Primary    New problem, new diagnosis. A1C and urine microalbumin today. Screening lipid as well. Advised to start metformin. Advised to increase to a total of 2000 mg daily as 1-2 weeks. Starting ACE as well.      Relevant Medications   lisinopril (PRINIVIL,ZESTRIL) 5 MG tablet   metFORMIN (GLUCOPHAGE) 500 MG tablet   Other Relevant Orders   Hemoglobin A1C   Lipid panel   Urine Microalbumin w/creat. ratio   Essential hypertension    New problem. Starting Lisinopril.      Relevant Medications   lisinopril (PRINIVIL,ZESTRIL) 5  MG tablet      Outpatient Encounter Prescriptions as of 04/06/2016  Medication Sig  . lisinopril (PRINIVIL,ZESTRIL) 5 MG tablet Take 1 tablet (5 mg total) by mouth daily.  . metFORMIN (GLUCOPHAGE) 500 MG tablet Take 1 tablet (500 mg total) by mouth 2 (two) times daily with a meal. Increase to 1000 mg twice daily after 1-2 weeks.  . [DISCONTINUED] metFORMIN (GLUCOPHAGE) 500 MG tablet Take 1 tablet (500 mg total) by mouth daily with breakfast. (Patient not taking: Reported on 04/06/2016)  . [DISCONTINUED] metFORMIN (GLUCOPHAGE) 500 MG tablet Take 1 tablet (500 mg total) by mouth 2 (two) times daily with a meal. Increase to 1000 mg twice daily after 1-2 weeks.   No facility-administered encounter medications on file as of 04/06/2016.     Follow-up: Return in about 3 months (around 07/07/2016).  Everlene OtherJayce Sibyl Mikula DO [facility] [place]

## 2016-04-06 NOTE — Assessment & Plan Note (Signed)
New problem, new diagnosis. A1C and urine microalbumin today. Screening lipid as well. Advised to start metformin. Advised to increase to a total of 2000 mg daily as 1-2 weeks. Starting ACE as well.

## 2016-04-06 NOTE — Assessment & Plan Note (Signed)
New problem. Starting Lisinopril.  

## 2016-04-07 ENCOUNTER — Telehealth: Payer: Self-pay | Admitting: Family Medicine

## 2016-04-07 NOTE — Telephone Encounter (Signed)
Pt was called and given labs.

## 2016-04-07 NOTE — Telephone Encounter (Signed)
Pt called in regards of lab results. Thank you!  Call pt @ [phone]

## 2016-04-08 ENCOUNTER — Other Ambulatory Visit: Payer: Self-pay | Admitting: Family Medicine

## 2016-04-08 DIAGNOSIS — IMO0001 Reserved for inherently not codable concepts without codable children: Secondary | ICD-10-CM

## 2016-04-08 DIAGNOSIS — E1165 Type 2 diabetes mellitus with hyperglycemia: Principal | ICD-10-CM

## 2016-04-08 MED ORDER — ATORVASTATIN CALCIUM 40 MG PO TABS: 40.0000 mg | ORAL_TABLET | Freq: Every day | ORAL | 3 refills | Status: DC

## 2016-04-08 MED ORDER — EMPAGLIFLOZIN 10 MG PO TABS: 10.0000 mg | ORAL_TABLET | Freq: Every day | ORAL | 1 refills | Status: DC

## 2016-04-08 NOTE — Telephone Encounter (Signed)
Pt was called and told to call if there were any problems in getting medications.

## 2016-04-08 NOTE — Telephone Encounter (Signed)
Please place referral for this pt.

## 2016-04-08 NOTE — Telephone Encounter (Signed)
Pt wants to be referred for diabetic education. Please advise.

## 2016-04-08 NOTE — Telephone Encounter (Signed)
Referral placed. Sent in RollaJardiance and lipitor. She can have samples and discount card for Jardiance.

## 2016-04-11 ENCOUNTER — Ambulatory Visit: Payer: Self-pay | Admitting: Family Medicine

## 2016-04-12 ENCOUNTER — Telehealth: Payer: Self-pay

## 2016-04-12 DIAGNOSIS — I10 Essential (primary) hypertension: Secondary | ICD-10-CM

## 2016-04-12 NOTE — Telephone Encounter (Signed)
Lab ordered. Thank you

## 2016-04-12 NOTE — Telephone Encounter (Signed)
Pt is on lab schedule for 04/13/16. No future orders placed. Pt recently had labs drawn 04/06/16. Unsure of what we are repeating.

## 2016-04-12 NOTE — Telephone Encounter (Signed)
BMP - Dx HTN. Started ACEI. Needs recheck.

## 2016-04-13 ENCOUNTER — Telehealth: Payer: Self-pay | Admitting: Family Medicine

## 2016-04-13 ENCOUNTER — Other Ambulatory Visit: Payer: BLUE CROSS/BLUE SHIELD

## 2016-04-13 NOTE — Telephone Encounter (Signed)
Please call pt at [phone]

## 2016-04-13 NOTE — Telephone Encounter (Signed)
Pt was called and told that her referral paperwork was sent she would need to wait on the office to contact her. She was given tips to follow when changing her diet.

## 2016-04-13 NOTE — Telephone Encounter (Signed)
Pt called and wanted just check and to see if she is on the right track as far as diet, wanted to speak with someone. The referral is being worked on. Thank you!  Call pt @ [phone]

## 2016-04-13 NOTE — Telephone Encounter (Signed)
LVTCB

## 2016-04-15 ENCOUNTER — Other Ambulatory Visit (INDEPENDENT_AMBULATORY_CARE_PROVIDER_SITE_OTHER): Payer: BLUE CROSS/BLUE SHIELD

## 2016-04-15 DIAGNOSIS — I10 Essential (primary) hypertension: Secondary | ICD-10-CM

## 2016-04-15 LAB — BASIC METABOLIC PANEL
BUN: 16 mg/dL (ref 6–23)
CHLORIDE: 102 meq/L (ref 96–112)
CO2: 24 mEq/L (ref 19–32)
Calcium: 9.7 mg/dL (ref 8.4–10.5)
Creatinine, Ser: 0.78 mg/dL (ref 0.40–1.20)
GFR: 103.35 mL/min (ref >60.00)
Glucose, Bld: 105 mg/dL — ABNORMAL HIGH (ref 70–99)
POTASSIUM: 4.6 meq/L (ref 3.5–5.1)
SODIUM: 135 meq/L (ref 135–145)

## 2016-04-27 ENCOUNTER — Telehealth: Payer: Self-pay | Admitting: *Deleted

## 2016-04-27 NOTE — Telephone Encounter (Signed)
FYI

## 2016-04-27 NOTE — Telephone Encounter (Signed)
FYI  [employer] Nurse reported that patient has been enrolled into [employer] Case Management  This is just a notification,no return call is necessary  If you have questions please call Ellan Lambertesha [phone]

## 2016-04-28 ENCOUNTER — Ambulatory Visit: Payer: BLUE CROSS/BLUE SHIELD | Admitting: *Deleted

## 2016-06-24 ENCOUNTER — Telehealth: Payer: Self-pay | Admitting: Family Medicine

## 2016-06-24 DIAGNOSIS — IMO0001 Reserved for inherently not codable concepts without codable children: Secondary | ICD-10-CM

## 2016-06-24 DIAGNOSIS — E1165 Type 2 diabetes mellitus with hyperglycemia: Principal | ICD-10-CM

## 2016-06-24 MED ORDER — METFORMIN HCL 500 MG PO TABS: ORAL_TABLET | ORAL | 1 refills | Status: DC

## 2016-06-24 MED ORDER — EMPAGLIFLOZIN 10 MG PO TABS: 10.0000 mg | ORAL_TABLET | Freq: Every day | ORAL | 1 refills | Status: DC

## 2016-06-24 NOTE — Telephone Encounter (Signed)
Pt called and is requesting a refill on her metFORMIN (GLUCOPHAGE) 500 MG tablet. She only has 2 pills left. Please advise, thank you!  Ph[facility]acy - [facility] [address] [facility] - [place] - [id number]  Call pt @ [phone].

## 2016-06-24 NOTE — Telephone Encounter (Signed)
rx sent

## 2016-07-07 ENCOUNTER — Ambulatory Visit: Payer: BLUE CROSS/BLUE SHIELD | Admitting: Family Medicine

## 2016-07-28 ENCOUNTER — Ambulatory Visit: Payer: BLUE CROSS/BLUE SHIELD | Admitting: Family Medicine

## 2016-07-28 DIAGNOSIS — Z0289 Encounter for other administrative examinations: Secondary | ICD-10-CM

## 2017-03-28 ENCOUNTER — Other Ambulatory Visit: Payer: Self-pay | Admitting: Family Medicine

## 2017-03-28 DIAGNOSIS — I10 Essential (primary) hypertension: Secondary | ICD-10-CM

## 2017-03-30 NOTE — Telephone Encounter (Signed)
Has not been seen since 04/06/16, please advise to refill?

## 2017-04-01 NOTE — Telephone Encounter (Signed)
Patient needs a follow-up scheduled and lab work prior to receiving a refill of this medication. Thanks.

## 2017-04-03 NOTE — Telephone Encounter (Signed)
BMET ordered.  Thanks.

## 2017-04-03 NOTE — Telephone Encounter (Signed)
I have scheduled lab appointment and patient with new PCP Dr Shirlee LatchMClean what lab Bmet? gave 30 day supply lisinopril.

## 2017-04-13 ENCOUNTER — Other Ambulatory Visit: Payer: BLUE CROSS/BLUE SHIELD

## 2017-04-19 ENCOUNTER — Ambulatory Visit: Payer: BLUE CROSS/BLUE SHIELD | Admitting: Internal Medicine

## 2017-05-03 ENCOUNTER — Other Ambulatory Visit: Payer: Self-pay | Admitting: Family Medicine

## 2017-05-05 NOTE — Telephone Encounter (Signed)
Refilled: 04/03/2017 Last OV: 04/06/2016 Next OV: Not scheduled

## 2017-05-05 NOTE — Telephone Encounter (Signed)
Patient no showed her appointment with Dr McLean-Scocuzza. I will not refill this and will forward to her to consider refilling as the patient was to establish care with her.

## 2017-05-11 ENCOUNTER — Telehealth: Payer: Self-pay | Admitting: *Deleted

## 2017-05-11 NOTE — Telephone Encounter (Signed)
Tried to reach patient by phone to advise she would need to schedule appointment and keep in order for further refill , left message for patient to call office needs appointment.

## 2017-05-11 NOTE — Telephone Encounter (Signed)
-----   Message from Bevelyn Bucklesracy N McLean-Scocuzza, MD sent at 05/05/2017  5:16 PM EST ----- Only refilled Lisinopril x 7 days/pills no refills  Has not been seen in > 1 year and no showed for an appt  Please schedule appt   Will not refill any future pts meds (including this patient) if they have not been seen in 1 year   Thanks TMS

## 2017-05-19 ENCOUNTER — Other Ambulatory Visit: Payer: Self-pay | Admitting: Family Medicine

## 2017-05-19 NOTE — Telephone Encounter (Signed)
Contacted pt regarding need for appointment before any refills can be granted; spoke with Teresa Simpson and was told that pt needs to set up a 30 min appointment for transfer of care from Dr Adriana Simasook, and that it will be up to the individual provider to refill her prescription since she was a no show at her previous appointment; pt was not on the phone when I tried to relay this information; attempted to contact pt at [phone] without success; left message on voice mail [phone].

## 2017-05-19 NOTE — Telephone Encounter (Signed)
Patient will need office visit for further refills per DR.Mclean, left message to return call ok for pec to speak and schedule patient

## 2017-06-08 ENCOUNTER — Other Ambulatory Visit: Payer: Self-pay | Admitting: Family Medicine

## 2017-06-09 NOTE — Telephone Encounter (Signed)
Patient has no showed for one appt to establish care there is no note from yesterday just one from 05/19/17, patient was given 7 tablets to last til appointment and patient failed to keep appointment. Patient has rescheduled for 07/11/17.

## 2017-06-09 NOTE — Telephone Encounter (Signed)
Caller name: Serafina RoyalsFoust,Debra Relation to pt: mother Call back number:  [phone] Pharmacy:  [facility] [address] [facility] - [place], [place] - [phone] (Phone) [phone])     Reason for call:  Patient scheduled appointment with Dr. Shirlee LatchMclean for 07/11/2017. Mother states she would like to speak with someone today regarding lisinopril (PRINIVIL,ZESTRIL) 5 MG tablet refill. Mother stated she spoke with a "nurse"  and they confirmed yesterday it would be taken care of, please advise

## 2017-06-12 ENCOUNTER — Telehealth: Payer: Self-pay | Admitting: *Deleted

## 2017-06-12 NOTE — Telephone Encounter (Signed)
-----   Message from Bevelyn Bucklesracy N McLean-Scocuzza, MD sent at 06/11/2017  1:42 PM EST ----- Please sch appt sooner  No further refills has not been seen in 1 year or greater   Thanks TMS

## 2017-06-12 NOTE — Telephone Encounter (Signed)
Tried to reach patient by phone left message to call office trying to schedule sooner appointment. Patient mother returned call said she would have daughter call back to schedule.

## 2017-07-11 ENCOUNTER — Ambulatory Visit: Payer: BLUE CROSS/BLUE SHIELD | Admitting: Internal Medicine

## 2017-07-11 ENCOUNTER — Other Ambulatory Visit: Payer: Self-pay | Admitting: Internal Medicine

## 2017-07-11 ENCOUNTER — Encounter: Payer: Self-pay | Admitting: Internal Medicine

## 2017-07-11 VITALS — BP 154/80 | HR 100 | Temp 98.3°F | Ht 70.0 in | Wt 258.0 lb

## 2017-07-11 DIAGNOSIS — E119 Type 2 diabetes mellitus without complications: Secondary | ICD-10-CM | POA: Diagnosis not present

## 2017-07-11 DIAGNOSIS — I10 Essential (primary) hypertension: Secondary | ICD-10-CM

## 2017-07-11 DIAGNOSIS — E669 Obesity, unspecified: Secondary | ICD-10-CM | POA: Diagnosis not present

## 2017-07-11 DIAGNOSIS — E1165 Type 2 diabetes mellitus with hyperglycemia: Secondary | ICD-10-CM | POA: Diagnosis not present

## 2017-07-11 DIAGNOSIS — R0683 Snoring: Secondary | ICD-10-CM | POA: Diagnosis not present

## 2017-07-11 DIAGNOSIS — Z1329 Encounter for screening for other suspected endocrine disorder: Secondary | ICD-10-CM | POA: Diagnosis not present

## 2017-07-11 DIAGNOSIS — IMO0001 Reserved for inherently not codable concepts without codable children: Secondary | ICD-10-CM

## 2017-07-11 LAB — COMPREHENSIVE METABOLIC PANEL
ALT: 16 U/L (ref 0–35)
AST: 14 U/L (ref 0–37)
Albumin: 4 g/dL (ref 3.5–5.2)
Alkaline Phosphatase: 78 U/L (ref 39–117)
BUN: 15 mg/dL (ref 6–23)
CHLORIDE: 101 meq/L (ref 96–112)
CO2: 28 mEq/L (ref 19–32)
Calcium: 9.2 mg/dL (ref 8.4–10.5)
Creatinine, Ser: 0.73 mg/dL (ref 0.40–1.20)
GFR: 110.93 mL/min (ref >60.00)
GLUCOSE: 272 mg/dL — AB (ref 70–99)
POTASSIUM: 4 meq/L (ref 3.5–5.1)
SODIUM: 136 meq/L (ref 135–145)
Total Bilirubin: 0.8 mg/dL (ref 0.2–1.2)
Total Protein: 7.8 g/dL (ref 6.0–8.3)

## 2017-07-11 LAB — CBC WITH DIFFERENTIAL/PLATELET
BASOS PCT: 0.4 % (ref 0.0–3.0)
Basophils Absolute: 0 10*3/uL (ref 0.0–0.1)
EOS PCT: 0.6 % (ref 0.0–5.0)
Eosinophils Absolute: 0.1 10*3/uL (ref 0.0–0.7)
HCT: 41.2 % (ref 36.0–46.0)
HEMOGLOBIN: 13.6 g/dL (ref 12.0–15.0)
LYMPHS ABS: 3.7 10*3/uL (ref 0.7–4.0)
Lymphocytes Relative: 40.9 % (ref 12.0–46.0)
MCHC: 33.1 g/dL (ref 30.0–36.0)
MCV: 89.2 fl (ref 78.0–100.0)
MONO ABS: 0.7 10*3/uL (ref 0.1–1.0)
MONOS PCT: 7.5 % (ref 3.0–12.0)
Neutro Abs: 4.6 10*3/uL (ref 1.4–7.7)
Neutrophils Relative %: 50.6 % (ref 43.0–77.0)
Platelets: 331 10*3/uL (ref 150.0–400.0)
RBC: 4.62 Mil/uL (ref 3.87–5.11)
RDW: 12.7 % (ref 11.5–15.5)
WBC: 9.1 10*3/uL (ref 4.0–10.5)

## 2017-07-11 LAB — URINALYSIS, ROUTINE W REFLEX MICROSCOPIC
BILIRUBIN URINE: NEGATIVE
Hgb urine dipstick: NEGATIVE
LEUKOCYTES UA: NEGATIVE
Nitrite: NEGATIVE
PH: 6 (ref 5.0–8.0)
Specific Gravity, Urine: >=1.03 — AB (ref 1.000–1.030)
Total Protein, Urine: NEGATIVE
UROBILINOGEN UA: 0.2 (ref 0.0–1.0)
Urine Glucose: >=1000 — AB

## 2017-07-11 LAB — HEMOGLOBIN A1C: Hgb A1c MFr Bld: 10.4 % — ABNORMAL HIGH (ref 4.6–6.5)

## 2017-07-11 LAB — TSH: TSH: 0.91 u[IU]/mL (ref 0.35–4.50)

## 2017-07-11 LAB — MICROALBUMIN / CREATININE URINE RATIO
CREATININE, U: 111 mg/dL
MICROALB/CREAT RATIO: 0.9 mg/g (ref 0.0–30.0)
Microalb, Ur: 1 mg/dL (ref 0.0–1.9)

## 2017-07-11 LAB — T4, FREE: FREE T4: 0.98 ng/dL (ref 0.60–1.60)

## 2017-07-11 MED ORDER — ATORVASTATIN CALCIUM 40 MG PO TABS: 40.0000 mg | ORAL_TABLET | Freq: Every day | ORAL | 3 refills | Status: DC

## 2017-07-11 MED ORDER — ACCU-CHEK MULTICLIX LANCETS MISC: 3 refills | Status: AC

## 2017-07-11 MED ORDER — GLUCOSE BLOOD VI STRP: ORAL_STRIP | 3 refills | Status: DC

## 2017-07-11 MED ORDER — METFORMIN HCL 500 MG PO TABS: ORAL_TABLET | ORAL | 1 refills | Status: DC

## 2017-07-11 MED ORDER — BLOOD GLUCOSE MONITOR KIT: PACK | 0 refills | Status: AC

## 2017-07-11 MED ORDER — SITAGLIPTIN PHOSPHATE 100 MG PO TABS: 100.0000 mg | ORAL_TABLET | Freq: Every day | ORAL | 3 refills | Status: DC

## 2017-07-11 MED ORDER — LISINOPRIL 5 MG PO TABS: 5.0000 mg | ORAL_TABLET | Freq: Every day | ORAL | 1 refills | Status: DC

## 2017-07-11 MED ORDER — EMPAGLIFLOZIN 10 MG PO TABS: 10.0000 mg | ORAL_TABLET | Freq: Every day | ORAL | 3 refills | Status: DC

## 2017-07-11 NOTE — Progress Notes (Signed)
Pre visit review using our clinic review tool, if applicable. No additional management support is needed unless otherwise documented below in the visit note. 

## 2017-07-11 NOTE — Patient Instructions (Signed)
F/u in 3-4 weeks sooner if needed   DASH Eating Plan DASH stands for "Dietary Approaches to Stop Hypertension." The DASH eating plan is a healthy eating plan that has been shown to reduce high blood pressure (hypertension). It may also reduce your risk for type 2 diabetes, heart disease, and stroke. The DASH eating plan may also help with weight loss. What are tips for following this plan? General guidelines  Avoid eating more than 2,300 mg (milligrams) of salt (sodium) a day. If you have hypertension, you may need to reduce your sodium intake to 1,500 mg a day.  Limit alcohol intake to no more than 1 drink a day for nonpregnant women and 2 drinks a day for men. One drink equals 12 oz of beer, 5 oz of wine, or 1 oz of hard liquor.  Work with your health care provider to maintain a healthy body weight or to lose weight. Ask what an ideal weight is for you.  Get at least 30 minutes of exercise that causes your heart to beat faster (aerobic exercise) most days of the week. Activities may include walking, swimming, or biking.  Work with your health care provider or diet and nutrition specialist (dietitian) to adjust your eating plan to your individual calorie needs. Reading food labels  Check food labels for the amount of sodium per serving. Choose foods with less than 5 percent of the Daily Value of sodium. Generally, foods with less than 300 mg of sodium per serving fit into this eating plan.  To find whole grains, look for the word "whole" as the first word in the ingredient list. Shopping  Buy products labeled as "low-sodium" or "no salt added."  Buy fresh foods. Avoid canned foods and premade or frozen meals. Cooking  Avoid adding salt when cooking. Use salt-free seasonings or herbs instead of table salt or sea salt. Check with your health care provider or pharmacist before using salt substitutes.  Do not fry foods. Cook foods using healthy methods such as baking, boiling, grilling,  and broiling instead.  Cook with heart-healthy oils, such as olive, canola, soybean, or sunflower oil. Meal planning   Eat a balanced diet that includes: ? 5 or more servings of fruits and vegetables each day. At each meal, try to fill half of your plate with fruits and vegetables. ? Up to 6-8 servings of whole grains each day. ? Less than 6 oz of lean meat, poultry, or fish each day. A 3-oz serving of meat is about the same size as a deck of cards. One egg equals 1 oz. ? 2 servings of low-fat dairy each day. ? A serving of nuts, seeds, or beans 5 times each week. ? Heart-healthy fats. Healthy fats called Omega-3 fatty acids are found in foods such as flaxseeds and coldwater fish, like sardines, salmon, and mackerel.  Limit how much you eat of the following: ? Canned or prepackaged foods. ? Food that is high in trans fat, such as fried foods. ? Food that is high in saturated fat, such as fatty meat. ? Sweets, desserts, sugary drinks, and other foods with added sugar. ? Full-fat dairy products.  Do not salt foods before eating.  Try to eat at least 2 vegetarian meals each week.  Eat more home-cooked food and less restaurant, buffet, and fast food.  When eating at a restaurant, ask that your food be prepared with less salt or no salt, if possible. What foods are recommended? The items listed may not be  a complete list. Talk with your dietitian about what dietary choices are best for you. Grains Whole-grain or whole-wheat bread. Whole-grain or whole-wheat pasta. Brown rice. [place]. Bulgur. Whole-grain and low-sodium cereals. Pita bread. Low-fat, low-sodium crackers. Whole-wheat flour tortillas. Vegetables Fresh or frozen vegetables (raw, steamed, roasted, or grilled). Low-sodium or reduced-sodium tomato and vegetable juice. Low-sodium or reduced-sodium tomato sauce and tomato paste. Low-sodium or reduced-sodium canned vegetables. Fruits All fresh, dried, or frozen fruit.  Canned fruit in natural juice (without added sugar). Meat and other protein foods Skinless chicken or [place]. Ground chicken or [place]. Pork with fat trimmed off. Fish and seafood. Egg whites. Dried beans, peas, or lentils. Unsalted nuts, nut butters, and seeds. Unsalted canned beans. Lean cuts of beef with fat trimmed off. Low-sodium, lean deli meat. Dairy Low-fat (1%) or fat-free (skim) milk. Fat-free, low-fat, or reduced-fat cheeses. Nonfat, low-sodium ricotta or cottage cheese. Low-fat or nonfat yogurt. Low-fat, low-sodium cheese. Fats and oils Soft margarine without trans fats. Vegetable oil. Low-fat, reduced-fat, or light mayonnaise and salad dressings (reduced-sodium). Canola, safflower, olive, soybean, and sunflower oils. Avocado. Seasoning and other foods Herbs. Spices. Seasoning mixes without salt. Unsalted popcorn and pretzels. Fat-free sweets. What foods are not recommended? The items listed may not be a complete list. Talk with your dietitian about what dietary choices are best for you. Grains Baked goods made with fat, such as croissants, muffins, or some breads. Dry pasta or rice meal packs. Vegetables Creamed or fried vegetables. Vegetables in a cheese sauce. Regular canned vegetables (not low-sodium or reduced-sodium). Regular canned tomato sauce and paste (not low-sodium or reduced-sodium). Regular tomato and vegetable juice (not low-sodium or reduced-sodium). Angie Fava. Olives. Fruits Canned fruit in a light or heavy syrup. Fried fruit. Fruit in cream or butter sauce. Meat and other protein foods Fatty cuts of meat. Ribs. Fried meat. Berniece Salines. Sausage. Bologna and other processed lunch meats. Salami. Fatback. Hotdogs. Bratwurst. Salted nuts and seeds. Canned beans with added salt. Canned or smoked fish. Whole eggs or egg yolks. Chicken or [place] with skin. Dairy Whole or 2% milk, cream, and half-and-half. Whole or full-fat cream cheese. Whole-fat or sweetened yogurt. Full-fat  cheese. Nondairy creamers. Whipped toppings. Processed cheese and cheese spreads. Fats and oils Butter. Stick margarine. Lard. Shortening. Ghee. Bacon fat. Tropical oils, such as coconut, palm kernel, or palm oil. Seasoning and other foods Salted popcorn and pretzels. Onion salt, garlic salt, seasoned salt, table salt, and sea salt. Worcestershire sauce. Tartar sauce. Barbecue sauce. Teriyaki sauce. Soy sauce, including reduced-sodium. Steak sauce. Canned and packaged gravies. Fish sauce. Oyster sauce. Cocktail sauce. Horseradish that you find on the shelf. Ketchup. Mustard. Meat flavorings and tenderizers. Bouillon cubes. Hot sauce and Tabasco sauce. Premade or packaged marinades. Premade or packaged taco seasonings. Relishes. Regular salad dressings. Where to find more information:  National Heart, Lung, and [place]: https://wilson-eaton.com/  American Heart Association: www.heart.org Summary  The DASH eating plan is a healthy eating plan that has been shown to reduce high blood pressure (hypertension). It may also reduce your risk for type 2 diabetes, heart disease, and stroke.  With the DASH eating plan, you should limit salt (sodium) intake to 2,300 mg a day. If you have hypertension, you may need to reduce your sodium intake to 1,500 mg a day.  When on the DASH eating plan, aim to eat more fresh fruits and vegetables, whole grains, lean proteins, low-fat dairy, and heart-healthy fats.  Work with your health care provider or diet and nutrition specialist (  dietitian) to adjust your eating plan to your individual calorie needs. This information is not intended to replace advice given to you by your health care provider. Make sure you discuss any questions you have with your health care provider. Document Released: 05/05/2011 Document Revised: 05/09/2016 Document Reviewed: 05/09/2016 Elsevier Interactive Patient Education  2018 [employer].  Hypertension Hypertension, commonly called high  blood pressure, is when the force of blood pumping through the arteries is too strong. The arteries are the blood vessels that carry blood from the heart throughout the body. Hypertension forces the heart to work harder to pump blood and may cause arteries to become narrow or stiff. Having untreated or uncontrolled hypertension can cause heart attacks, strokes, kidney disease, and other problems. A blood pressure reading consists of a higher number over a lower number. Ideally, your blood pressure should be below 120/80. The first ("top") number is called the systolic pressure. It is a measure of the pressure in your arteries as your heart beats. The second ("bottom") number is called the diastolic pressure. It is a measure of the pressure in your arteries as the heart relaxes. What are the causes? The cause of this condition is not known. What increases the risk? Some risk factors for high blood pressure are under your control. Others are not. Factors you can change  Smoking.  Having type 2 diabetes mellitus, high cholesterol, or both.  Not getting enough exercise or physical activity.  Being overweight.  Having too much fat, sugar, calories, or salt (sodium) in your diet.  Drinking too much alcohol. Factors that are difficult or impossible to change  Having chronic kidney disease.  Having a family history of high blood pressure.  Age. Risk increases with age.  Race. You may be at higher risk if you are African-American.  Gender. Men are at higher risk than women before age 40. After age 71, women are at higher risk than men.  Having obstructive sleep apnea.  Stress. What are the signs or symptoms? Extremely high blood pressure (hypertensive crisis) may cause:  Headache.  Anxiety.  Shortness of breath.  Nosebleed.  Nausea and vomiting.  Severe chest pain.  Jerky movements you cannot control (seizures).  How is this diagnosed? This condition is diagnosed by  measuring your blood pressure while you are seated, with your arm resting on a surface. The cuff of the blood pressure monitor will be placed directly against the skin of your upper arm at the level of your heart. It should be measured at least twice using the same arm. Certain conditions can cause a difference in blood pressure between your right and left arms. Certain factors can cause blood pressure readings to be lower or higher than normal (elevated) for a short period of time:  When your blood pressure is higher when you are in a health care provider's office than when you are at home, this is called white coat hypertension. Most people with this condition do not need medicines.  When your blood pressure is higher at home than when you are in a health care provider's office, this is called masked hypertension. Most people with this condition may need medicines to control blood pressure.  If you have a high blood pressure reading during one visit or you have normal blood pressure with other risk factors:  You may be asked to return on a different day to have your blood pressure checked again.  You may be asked to monitor your blood pressure at home  for 1 week or longer.  If you are diagnosed with hypertension, you may have other blood or imaging tests to help your health care provider understand your overall risk for other conditions. How is this treated? This condition is treated by making healthy lifestyle changes, such as eating healthy foods, exercising more, and reducing your alcohol intake. Your health care provider may prescribe medicine if lifestyle changes are not enough to get your blood pressure under control, and if:  Your systolic blood pressure is above 130.  Your diastolic blood pressure is above 80.  Your personal target blood pressure may vary depending on your medical conditions, your age, and other factors. Follow these instructions at home: Eating and drinking  Eat a  diet that is high in fiber and potassium, and low in sodium, added sugar, and fat. An example eating plan is called the DASH (Dietary Approaches to Stop Hypertension) diet. To eat this way: ? Eat plenty of fresh fruits and vegetables. Try to fill half of your plate at each meal with fruits and vegetables. ? Eat whole grains, such as whole wheat pasta, brown rice, or whole grain bread. Fill about one quarter of your plate with whole grains. ? Eat or drink low-fat dairy products, such as skim milk or low-fat yogurt. ? Avoid fatty cuts of meat, processed or cured meats, and poultry with skin. Fill about one quarter of your plate with lean proteins, such as fish, chicken without skin, beans, eggs, and tofu. ? Avoid premade and processed foods. These tend to be higher in sodium, added sugar, and fat.  Reduce your daily sodium intake. Most people with hypertension should eat less than 1,500 mg of sodium a day.  Limit alcohol intake to no more than 1 drink a day for nonpregnant women and 2 drinks a day for men. One drink equals 12 oz of beer, 5 oz of wine, or 1 oz of hard liquor. Lifestyle  Work with your health care provider to maintain a healthy body weight or to lose weight. Ask what an ideal weight is for you.  Get at least 30 minutes of exercise that causes your heart to beat faster (aerobic exercise) most days of the week. Activities may include walking, swimming, or biking.  Include exercise to strengthen your muscles (resistance exercise), such as pilates or lifting weights, as part of your weekly exercise routine. Try to do these types of exercises for 30 minutes at least 3 days a week.  Do not use any products that contain nicotine or tobacco, such as cigarettes and e-cigarettes. If you need help quitting, ask your health care provider.  Monitor your blood pressure at home as told by your health care provider.  Keep all follow-up visits as told by your health care provider. This is  important. Medicines  Take over-the-counter and prescription medicines only as told by your health care provider. Follow directions carefully. Blood pressure medicines must be taken as prescribed.  Do not skip doses of blood pressure medicine. Doing this puts you at risk for problems and can make the medicine less effective.  Ask your health care provider about side effects or reactions to medicines that you should watch for. Contact a health care provider if:  You think you are having a reaction to a medicine you are taking.  You have headaches that keep coming back (recurring).  You feel dizzy.  You have swelling in your ankles.  You have trouble with your vision. Get help right away if:  You develop a severe headache or confusion.  You have unusual weakness or numbness.  You feel faint.  You have severe pain in your chest or abdomen.  You vomit repeatedly.  You have trouble breathing. Summary  Hypertension is when the force of blood pumping through your arteries is too strong. If this condition is not controlled, it may put you at risk for serious complications.  Your personal target blood pressure may vary depending on your medical conditions, your age, and other factors. For most people, a normal blood pressure is less than 120/80.  Hypertension is treated with lifestyle changes, medicines, or a combination of both. Lifestyle changes include weight loss, eating a healthy, low-sodium diet, exercising more, and limiting alcohol. This information is not intended to replace advice given to you by your health care provider. Make sure you discuss any questions you have with your health care provider. Document Released: 05/16/2005 Document Revised: 04/13/2016 Document Reviewed: 04/13/2016 Elsevier Interactive Patient Education  2018 [employer].  Diabetes Mellitus and Nutrition When you have diabetes (diabetes mellitus), it is very important to have healthy eating habits  because your blood sugar (glucose) levels are greatly affected by what you eat and drink. Eating healthy foods in the appropriate amounts, at about the same times every day, can help you:  Control your blood glucose.  Lower your risk of heart disease.  Improve your blood pressure.  Reach or maintain a healthy weight.  Every person with diabetes is different, and each person has different needs for a meal plan. Your health care provider may recommend that you work with a diet and nutrition specialist (dietitian) to make a meal plan that is best for you. Your meal plan may vary depending on factors such as:  The calories you need.  The medicines you take.  Your weight.  Your blood glucose, blood pressure, and cholesterol levels.  Your activity level.  Other health conditions you have, such as heart or kidney disease.  How do carbohydrates affect me? Carbohydrates affect your blood glucose level more than any other type of food. Eating carbohydrates naturally increases the amount of glucose in your blood. Carbohydrate counting is a method for keeping track of how many carbohydrates you eat. Counting carbohydrates is important to keep your blood glucose at a healthy level, especially if you use insulin or take certain oral diabetes medicines. It is important to know how many carbohydrates you can safely have in each meal. This is different for every person. Your dietitian can help you calculate how many carbohydrates you should have at each meal and for snack. Foods that contain carbohydrates include:  Bread, cereal, rice, pasta, and crackers.  Potatoes and corn.  Peas, beans, and lentils.  Milk and yogurt.  Fruit and juice.  Desserts, such as cakes, cookies, ice cream, and candy.  How does alcohol affect me? Alcohol can cause a sudden decrease in blood glucose (hypoglycemia), especially if you use insulin or take certain oral diabetes medicines. Hypoglycemia can be a  life-threatening condition. Symptoms of hypoglycemia (sleepiness, dizziness, and confusion) are similar to symptoms of having too much alcohol. If your health care provider says that alcohol is safe for you, follow these guidelines:  Limit alcohol intake to no more than 1 drink per day for nonpregnant women and 2 drinks per day for men. One drink equals 12 oz of beer, 5 oz of wine, or 1 oz of hard liquor.  Do not drink on an empty stomach.  Keep yourself  hydrated with water, diet soda, or unsweetened iced tea.  Keep in mind that regular soda, juice, and other mixers may contain a lot of sugar and must be counted as carbohydrates.  What are tips for following this plan? Reading food labels  Start by checking the serving size on the label. The amount of calories, carbohydrates, fats, and other nutrients listed on the label are based on one serving of the food. Many foods contain more than one serving per package.  Check the total grams (g) of carbohydrates in one serving. You can calculate the number of servings of carbohydrates in one serving by dividing the total carbohydrates by 15. For example, if a food has 30 g of total carbohydrates, it would be equal to 2 servings of carbohydrates.  Check the number of grams (g) of saturated and trans fats in one serving. Choose foods that have low or no amount of these fats.  Check the number of milligrams (mg) of sodium in one serving. Most people should limit total sodium intake to less than 2,300 mg per day.  Always check the nutrition information of foods labeled as "low-fat" or "nonfat". These foods may be higher in added sugar or refined carbohydrates and should be avoided.  Talk to your dietitian to identify your daily goals for nutrients listed on the label. Shopping  Avoid buying canned, premade, or processed foods. These foods tend to be high in fat, sodium, and added sugar.  Shop around the outside edge of the grocery store. This  includes fresh fruits and vegetables, bulk grains, fresh meats, and fresh dairy. Cooking  Use low-heat cooking methods, such as baking, instead of high-heat cooking methods like deep frying.  Cook using healthy oils, such as olive, canola, or sunflower oil.  Avoid cooking with butter, cream, or high-fat meats. Meal planning  Eat meals and snacks regularly, preferably at the same times every day. Avoid going long periods of time without eating.  Eat foods high in fiber, such as fresh fruits, vegetables, beans, and whole grains. Talk to your dietitian about how many servings of carbohydrates you can eat at each meal.  Eat 4-6 ounces of lean protein each day, such as lean meat, chicken, fish, eggs, or tofu. 1 ounce is equal to 1 ounce of meat, chicken, or fish, 1 egg, or 1/4 cup of tofu.  Eat some foods each day that contain healthy fats, such as avocado, nuts, seeds, and fish. Lifestyle   Check your blood glucose regularly.  Exercise at least 30 minutes 5 or more days each week, or as told by your health care provider.  Take medicines as told by your health care provider.  Do not use any products that contain nicotine or tobacco, such as cigarettes and e-cigarettes. If you need help quitting, ask your health care provider.  Work with a Veterinary surgeoncounselor or diabetes educator to identify strategies to manage stress and any emotional and social challenges. What are some questions to ask my health care provider?  Do I need to meet with a diabetes educator?  Do I need to meet with a dietitian?  What number can I call if I have questions?  When are the best times to check my blood glucose? Where to find more information:  American Diabetes Association: diabetes.org/food-and-fitness/food  Academy of Nutrition and Dietetics: https://www.vargas.com/www.eatright.org/resources/health/diseases-and-conditions/diabetes  [employer] of Diabetes and Digestive and Kidney Diseases (NIH):  FindJewelers.czwww.niddk.nih.gov/health-information/diabetes/overview/diet-eating-physical-activity Summary  A healthy meal plan will help you control your blood glucose and maintain a  healthy lifestyle.  Working with a diet and nutrition specialist (dietitian) can help you make a meal plan that is best for you.  Keep in mind that carbohydrates and alcohol have immediate effects on your blood glucose levels. It is important to count carbohydrates and to use alcohol carefully. This information is not intended to replace advice given to you by your health care provider. Make sure you discuss any questions you have with your health care provider. Document Released: 02/10/2005 Document Revised: 06/20/2016 Document Reviewed: 06/20/2016 Elsevier Interactive Patient Education  [employer].

## 2017-07-11 NOTE — Progress Notes (Signed)
Chief Complaint  Patient presents with  . Follow-up    transfer from dr. Adriana Simas   Follow up  1. She reports has not been here a while b/c did not have a good experience last visit and treatment plan nor diagnoses were explained to her and her mother made this appt for her to come  2. HTN out of lisinopril 5 mg x 2 months. Uncontrolled today  3. DM 2 last A1C 2017 >12 she has only been taking metformin 500 mg 2 pills in the am but reports it causes diarrhea at this dose and even at higher doses. She wants labs checked today for diabetes and reports she has been trying to eat healthier but she does not exercise. Last time she saw eye MD was spring 2018 at [facility]. She reports medications for DM were not explained to her at last visit so she never took lipitor 40 or Jardiance 10 mg.   4. She does snore at night and will consider sleep study in the future     Review of Systems  Constitutional: Negative for weight loss.  HENT: Negative for hearing loss.   Eyes: Negative for blurred vision.  Respiratory: Negative for wheezing.        +SOB with exertion   Cardiovascular: Negative for chest pain.  Gastrointestinal: Negative for abdominal pain.  Musculoskeletal: Negative for falls.  Skin: Negative for rash.  Neurological: Negative for headaches.  Psychiatric/Behavioral: Negative for depression.   Past Medical History:  Diagnosis Date  . Allergy   . Asthma    childhood   . Diabetes mellitus without complication (HCC)   . High blood pressure   . Kidney stones    Past Surgical History:  Procedure Laterality Date  . ABDOMINAL HYSTERECTOMY     age 87 ovary inflamed per pt had total hysterectomy  . APPENDECTOMY     appendicitis lead to scar tissue   . CHOLECYSTECTOMY    . EXPLORATORY LAPAROTOMY    . LAPAROSCOPY  2001   Family History  Problem Relation Age of Onset  . Alcohol abuse Father   . Diabetes Father   . Hypertension Father   . Breast cancer Maternal Aunt   . Breast cancer  Paternal Aunt   . Diabetes Maternal Grandmother   . Colon cancer Maternal Grandmother   . Glaucoma Mother   . Cancer Maternal Grandfather        colon cancer in 7s    Social History   Socioeconomic History  . Marital status: Single    Spouse name: Not on file  . Number of children: Not on file  . Years of education: Not on file  . Highest education level: Not on file  Social Needs  . Financial resource strain: Not on file  . Food insecurity - worry: Not on file  . Food insecurity - inability: Not on file  . Transportation needs - medical: Not on file  . Transportation needs - non-medical: Not on file  Occupational History  . Not on file  Tobacco Use  . Smoking status: Never Smoker  . Smokeless tobacco: Never Used  Substance and Sexual Activity  . Alcohol use: Yes  . Drug use: No  . Sexual activity: Yes    Partners: Male  Other Topics Concern  . Not on file  Social History Narrative   1 brother    Nieces and nephews    Current Meds  Medication Sig  . atorvastatin (LIPITOR) 40 MG tablet Take 1  tablet (40 mg total) by mouth daily.  Marland Kitchen lisinopril (PRINIVIL,ZESTRIL) 5 MG tablet Take 1 tablet (5 mg total) by mouth daily.  . metFORMIN (GLUCOPHAGE) 500 MG tablet 1 pill qam with food  . [DISCONTINUED] atorvastatin (LIPITOR) 40 MG tablet Take 1 tablet (40 mg total) by mouth daily.  . [DISCONTINUED] empagliflozin (JARDIANCE) 10 MG TABS tablet Take 10 mg by mouth daily.  . [DISCONTINUED] lisinopril (PRINIVIL,ZESTRIL) 5 MG tablet take 1 tablet by mouth once daily  . [DISCONTINUED] metFORMIN (GLUCOPHAGE) 500 MG tablet Increase to 1000 mg twice daily after 1-2 weeks.   No Known Allergies Recent Results (from the past 2160 hour(s))  Comprehensive metabolic panel     Status: Abnormal   Collection Time: 07/11/17  2:14 PM  Result Value Ref Range   Sodium 136 135 - 145 mEq/L   Potassium 4.0 3.5 - 5.1 mEq/L   Chloride 101 96 - 112 mEq/L   CO2 28 19 - 32 mEq/L   Glucose, Bld 272 (H)  70 - 99 mg/dL   BUN 15 6 - 23 mg/dL   Creatinine, Ser 1.61 0.40 - 1.20 mg/dL   Total Bilirubin 0.8 0.2 - 1.2 mg/dL   Alkaline Phosphatase 78 39 - 117 U/L   AST 14 0 - 37 U/L   ALT 16 0 - 35 U/L   Total Protein 7.8 6.0 - 8.3 g/dL   Albumin 4.0 3.5 - 5.2 g/dL   Calcium 9.2 8.4 - 09.6 mg/dL   GFR 045.40 >98.11 mL/min  CBC with Differential/Platelet     Status: None   Collection Time: 07/11/17  2:14 PM  Result Value Ref Range   WBC 9.1 4.0 - 10.5 K/uL   RBC 4.62 3.87 - 5.11 Mil/uL   Hemoglobin 13.6 12.0 - 15.0 g/dL   HCT 91.4 78.2 - 95.6 %   MCV 89.2 78.0 - 100.0 fl   MCHC 33.1 30.0 - 36.0 g/dL   RDW 21.3 08.6 - 57.8 %   Platelets 331.0 150.0 - 400.0 K/uL   Neutrophils Relative % 50.6 43.0 - 77.0 %   Lymphocytes Relative 40.9 12.0 - 46.0 %   Monocytes Relative 7.5 3.0 - 12.0 %   Eosinophils Relative 0.6 0.0 - 5.0 %   Basophils Relative 0.4 0.0 - 3.0 %   Neutro Abs 4.6 1.4 - 7.7 K/uL   Lymphs Abs 3.7 0.7 - 4.0 K/uL   Monocytes Absolute 0.7 0.1 - 1.0 K/uL   Eosinophils Absolute 0.1 0.0 - 0.7 K/uL   Basophils Absolute 0.0 0.0 - 0.1 K/uL  T4, free     Status: None   Collection Time: 07/11/17  2:14 PM  Result Value Ref Range   Free T4 0.98 0.60 - 1.60 ng/dL    Comment: Specimens from patients who are undergoing biotin therapy and /or ingesting biotin supplements may contain high levels of biotin.  The higher biotin concentration in these specimens interferes with this Free T4 assay.  Specimens that contain high levels  of biotin may cause false high results for this Free T4 assay.  Please interpret results in light of the total clinical presentation of the patient.    TSH     Status: None   Collection Time: 07/11/17  2:14 PM  Result Value Ref Range   TSH 0.91 0.35 - 4.50 uIU/mL  Urinalysis, Routine w reflex microscopic     Status: Abnormal   Collection Time: 07/11/17  2:14 PM  Result Value Ref Range   Color, Urine YELLOW Yellow;Lt.  Yellow   APPearance CLEAR Clear   Specific  Gravity, Urine >=1.030 (A) 1.000 - 1.030   pH 6.0 5.0 - 8.0   Total Protein, Urine NEGATIVE Negative   Urine Glucose >=1000 (A) Negative   Ketones, ur TRACE (A) Negative   Bilirubin Urine NEGATIVE Negative   Hgb urine dipstick NEGATIVE Negative   Urobilinogen, UA 0.2 0.0 - 1.0   Leukocytes, UA NEGATIVE Negative   Nitrite NEGATIVE Negative   WBC, UA 0-2/hpf 0-2/hpf   RBC / HPF 0-2/hpf 0-2/hpf   Squamous Epithelial / LPF Rare(0-4/hpf) Rare(0-4/hpf)  Urine Microalbumin w/creat. ratio     Status: None   Collection Time: 07/11/17  2:14 PM  Result Value Ref Range   Microalb, Ur 1.0 0.0 - 1.9 mg/dL   Creatinine,U 811.9 mg/dL   Microalb Creat Ratio 0.9 0.0 - 30.0 mg/g  Hemoglobin A1c     Status: Abnormal   Collection Time: 07/11/17  2:14 PM  Result Value Ref Range   Hgb A1c MFr Bld 10.4 (H) 4.6 - 6.5 %    Comment: Glycemic Control Guidelines for People with Diabetes:Non Diabetic:  <6%Goal of Therapy: <7%Additional Action Suggested:  >8%    Objective  Body mass index is 37.02 kg/m. Wt Readings from Last 3 Encounters:  07/11/17 258 lb (117 kg)  04/06/16 287 lb 8 oz (130.4 kg)  04/05/16 280 lb (127 kg)   Temp Readings from Last 3 Encounters:  07/11/17 98.3 F (36.8 C) (Oral)  04/06/16 98.4 F (36.9 C) (Oral)  04/05/16 97.9 F (36.6 C) (Oral)   BP Readings from Last 3 Encounters:  07/11/17 (!) 154/80  04/06/16 (!) 134/98  04/05/16 121/79   Pulse Readings from Last 3 Encounters:  07/11/17 100  04/06/16 88  04/05/16 81   O2 sat room air 98%   Physical Exam  Constitutional: She is oriented to person, place, and time and well-developed, well-nourished, and in no distress.  HENT:  Head: Normocephalic and atraumatic.  Mouth/Throat: Oropharynx is clear and moist and mucous membranes are normal.  Eyes: Conjunctivae are normal. Pupils are equal, round, and reactive to light.  Cardiovascular: Normal rate, regular rhythm and normal heart sounds.  Pulmonary/Chest: Effort normal  and breath sounds normal.  Neurological: She is alert and oriented to person, place, and time. Gait normal. Gait normal.  Skin: Skin is warm, dry and intact.  Psychiatric: Mood, memory, affect and judgment normal.  Nursing note and vitals reviewed.   Assessment   1. HTN 2. DM 2  3. H/o snoring c/w OSA  4. Obesity BMI 37.02  5. HM  Plan  1.  Resume lisinopril 5 mg may need to titrate up in future  Check labs today CMET, CBC, A1C, UA, protein, TSH, T4, will do lipid when fasting  Declines STD check  2. Resume lipitor 40 mg qhs  Rx Januvia 100, Jardiance 10 mg qd  Consider nutrition consult in future  Meter and supplies  Need to get copy of eye exam [facility] spring 2018  Will do foot exam in future Pt doesn't want to try Victoza or injectables.  3 Pt will consider sleep study in future  4.  Disc healthy diet choices and rec exercise   5. Declines flu shot, pna 23 vaccine Tdap UTD I think will review Vx database  Get records eye exam had spring 2018 [facility]  Check lipid in future  Declines STD check  S/p hysterectomy age 24 y.o total  Declines mammogram today will ask again  at f/u     >30 minutes spent with pt going over diagnoses medication options and tx plan    Provider: Dr. French Anaracy McLean-Scocuzza-Internal Medicine

## 2017-07-12 ENCOUNTER — Telehealth: Payer: Self-pay | Admitting: Internal Medicine

## 2017-07-12 NOTE — Telephone Encounter (Unsigned)
Copied from CRM [phone]. Topic: Inquiry >> Jul 12, 2017 12:09 PM Raquel SarnaHayes, Teresa G wrote: Pt calling back to check on recent lab results.

## 2017-07-12 NOTE — Telephone Encounter (Unsigned)
Copied from CRM #[id number]. Topic: Inquiry °>> Jul 12, 2017 12:09 PM Hayes, Teresa G wrote: °Pt calling back to check on recent lab results. ° °

## 2017-08-01 ENCOUNTER — Ambulatory Visit: Payer: BLUE CROSS/BLUE SHIELD | Admitting: Internal Medicine

## 2017-09-06 ENCOUNTER — Encounter: Payer: Self-pay | Admitting: Internal Medicine

## 2017-09-06 ENCOUNTER — Ambulatory Visit (INDEPENDENT_AMBULATORY_CARE_PROVIDER_SITE_OTHER): Payer: BLUE CROSS/BLUE SHIELD | Admitting: Internal Medicine

## 2017-09-06 VITALS — BP 138/96 | HR 94 | Temp 98.2°F | Ht 70.0 in | Wt 280.2 lb

## 2017-09-06 DIAGNOSIS — I10 Essential (primary) hypertension: Secondary | ICD-10-CM | POA: Diagnosis not present

## 2017-09-06 DIAGNOSIS — Z1231 Encounter for screening mammogram for malignant neoplasm of breast: Secondary | ICD-10-CM

## 2017-09-06 DIAGNOSIS — E119 Type 2 diabetes mellitus without complications: Secondary | ICD-10-CM

## 2017-09-06 MED ORDER — GLUCOSE BLOOD VI STRP: ORAL_STRIP | 3 refills | Status: AC

## 2017-09-06 MED ORDER — LISINOPRIL 5 MG PO TABS: 5.0000 mg | ORAL_TABLET | Freq: Every day | ORAL | 1 refills | Status: DC

## 2017-09-06 NOTE — Progress Notes (Signed)
Pre visit review using our clinic review tool, if applicable. No additional management support is needed unless otherwise documented below in the visit note. 

## 2017-09-06 NOTE — Progress Notes (Signed)
Chief Complaint  Patient presents with  . Follow-up   F/u  1. HTn improved though not at goal she has not had meds today on lis 5 mg qd  2. DM 2 on Jardiance 10 mg qd, Januvia 100 mg qd, metformin 500 mg qam    Review of Systems  Constitutional: Negative for weight loss.  HENT: Negative for hearing loss.   Eyes: Negative for blurred vision.  Respiratory: Negative for shortness of breath.   Cardiovascular: Negative for chest pain.  Gastrointestinal: Negative for abdominal pain.  Musculoskeletal: Negative for falls.  Skin: Negative for rash.  Neurological: Negative for headaches.  Psychiatric/Behavioral: Negative for depression.   Past Medical History:  Diagnosis Date  . Allergy   . Asthma    childhood   . Diabetes mellitus without complication ([place])   . High blood pressure   . Kidney stones    Past Surgical History:  Procedure Laterality Date  . ABDOMINAL HYSTERECTOMY     age 47 ovary inflamed per pt had total hysterectomy  . APPENDECTOMY     appendicitis lead to scar tissue   . CHOLECYSTECTOMY    . EXPLORATORY LAPAROTOMY    . LAPAROSCOPY  2001   Family History  Problem Relation Age of Onset  . Alcohol abuse Father   . Diabetes Father   . Hypertension Father   . Breast cancer Maternal Aunt   . Breast cancer Paternal Aunt   . Diabetes Maternal Grandmother   . Colon cancer Maternal Grandmother   . Glaucoma Mother   . Cancer Maternal Grandfather        colon cancer in 69s    Social History   Socioeconomic History  . Marital status: Single    Spouse name: Not on file  . Number of children: Not on file  . Years of education: Not on file  . Highest education level: Not on file  Occupational History  . Not on file  Social Needs  . Financial resource strain: Not on file  . Food insecurity:    Worry: Not on file    Inability: Not on file  . Transportation needs:    Medical: Not on file    Non-medical: Not on file  Tobacco Use  . Smoking status: Never  Smoker  . Smokeless tobacco: Never Used  Substance and Sexual Activity  . Alcohol use: Yes  . Drug use: No  . Sexual activity: Yes    Partners: Male  Lifestyle  . Physical activity:    Days per week: Not on file    Minutes per session: Not on file  . Stress: Not on file  Relationships  . Social connections:    Talks on phone: Not on file    Gets together: Not on file    Attends religious service: Not on file    Active member of club or organization: Not on file    Attends meetings of clubs or organizations: Not on file    Relationship status: Not on file  . Intimate partner violence:    Fear of current or ex partner: Not on file    Emotionally abused: Not on file    Physically abused: Not on file    Forced sexual activity: Not on file  Other Topics Concern  . Not on file  Social History Narrative   1 brother    Nieces and nephews    Current Meds  Medication Sig  . atorvastatin (LIPITOR) 40 MG tablet Take 1 tablet (  40 mg total) by mouth daily.  . blood glucose meter kit and supplies KIT Dispense based on patient and insurance preference. Use up to three times daily as directed. E11.9  . empagliflozin (JARDIANCE) 10 MG TABS tablet Take 10 mg by mouth daily.  Marland Kitchen glucose blood test strip Use as instructed.check bid to tid (am before breakfast, after lunch and dinner) E11.9  . Lancets (ACCU-CHEK MULTICLIX) lancets Use as instructed check sugar 3x per day E11.9  . lisinopril (PRINIVIL,ZESTRIL) 5 MG tablet Take 1 tablet (5 mg total) by mouth daily.  . metFORMIN (GLUCOPHAGE) 500 MG tablet 1 pill qam with food  . sitaGLIPtin (JANUVIA) 100 MG tablet Take 1 tablet (100 mg total) by mouth daily.  . [DISCONTINUED] glucose blood test strip Use as instructed.check bid to tid (am before breakfast, after lunch and dinner) E11.9  . [DISCONTINUED] lisinopril (PRINIVIL,ZESTRIL) 5 MG tablet Take 1 tablet (5 mg total) by mouth daily.   No Known Allergies Recent Results (from the past 2160  hour(s))  Comprehensive metabolic panel     Status: Abnormal   Collection Time: 07/11/17  2:14 PM  Result Value Ref Range   Sodium 136 135 - 145 mEq/L   Potassium 4.0 3.5 - 5.1 mEq/L   Chloride 101 96 - 112 mEq/L   CO2 28 19 - 32 mEq/L   Glucose, Bld 272 (H) 70 - 99 mg/dL   BUN 15 6 - 23 mg/dL   Creatinine, Ser 0.73 0.40 - 1.20 mg/dL   Total Bilirubin 0.8 0.2 - 1.2 mg/dL   Alkaline Phosphatase 78 39 - 117 U/L   AST 14 0 - 37 U/L   ALT 16 0 - 35 U/L   Total Protein 7.8 6.0 - 8.3 g/dL   Albumin 4.0 3.5 - 5.2 g/dL   Calcium 9.2 8.4 - 10.5 mg/dL   GFR 110.93 >60.00 mL/min  CBC with Differential/Platelet     Status: None   Collection Time: 07/11/17  2:14 PM  Result Value Ref Range   WBC 9.1 4.0 - 10.5 K/uL   RBC 4.62 3.87 - 5.11 Mil/uL   Hemoglobin 13.6 12.0 - 15.0 g/dL   HCT 41.2 36.0 - 46.0 %   MCV 89.2 78.0 - 100.0 fl   MCHC 33.1 30.0 - 36.0 g/dL   RDW 12.7 11.5 - 15.5 %   Platelets 331.0 150.0 - 400.0 K/uL   Neutrophils Relative % 50.6 43.0 - 77.0 %   Lymphocytes Relative 40.9 12.0 - 46.0 %   Monocytes Relative 7.5 3.0 - 12.0 %   Eosinophils Relative 0.6 0.0 - 5.0 %   Basophils Relative 0.4 0.0 - 3.0 %   Neutro Abs 4.6 1.4 - 7.7 K/uL   Lymphs Abs 3.7 0.7 - 4.0 K/uL   Monocytes Absolute 0.7 0.1 - 1.0 K/uL   Eosinophils Absolute 0.1 0.0 - 0.7 K/uL   Basophils Absolute 0.0 0.0 - 0.1 K/uL  T4, free     Status: None   Collection Time: 07/11/17  2:14 PM  Result Value Ref Range   Free T4 0.98 0.60 - 1.60 ng/dL    Comment: Specimens from patients who are undergoing biotin therapy and /or ingesting biotin supplements may contain high levels of biotin.  The higher biotin concentration in these specimens interferes with this Free T4 assay.  Specimens that contain high levels  of biotin may cause false high results for this Free T4 assay.  Please interpret results in light of the total clinical presentation of the  patient.    TSH     Status: None   Collection Time: 07/11/17  2:14  PM  Result Value Ref Range   TSH 0.91 0.35 - 4.50 uIU/mL  Urinalysis, Routine w reflex microscopic     Status: Abnormal   Collection Time: 07/11/17  2:14 PM  Result Value Ref Range   Color, Urine YELLOW Yellow;Lt. Yellow   APPearance CLEAR Clear   Specific Gravity, Urine >=1.030 (A) 1.000 - 1.030   pH 6.0 5.0 - 8.0   Total Protein, Urine NEGATIVE Negative   Urine Glucose >=1000 (A) Negative   Ketones, ur TRACE (A) Negative   Bilirubin Urine NEGATIVE Negative   Hgb urine dipstick NEGATIVE Negative   Urobilinogen, UA 0.2 0.0 - 1.0   Leukocytes, UA NEGATIVE Negative   Nitrite NEGATIVE Negative   WBC, UA 0-2/hpf 0-2/hpf   RBC / HPF 0-2/hpf 0-2/hpf   Squamous Epithelial / LPF Rare(0-4/hpf) Rare(0-4/hpf)  Urine Microalbumin w/creat. ratio     Status: None   Collection Time: 07/11/17  2:14 PM  Result Value Ref Range   Microalb, Ur 1.0 0.0 - 1.9 mg/dL   Creatinine,U 111.0 mg/dL   Microalb Creat Ratio 0.9 0.0 - 30.0 mg/g  Hemoglobin A1c     Status: Abnormal   Collection Time: 07/11/17  2:14 PM  Result Value Ref Range   Hgb A1c MFr Bld 10.4 (H) 4.6 - 6.5 %    Comment: Glycemic Control Guidelines for People with Diabetes:Non Diabetic:  <6%Goal of Therapy: <7%Additional Action Suggested:  >8%    Objective  Body mass index is 40.2 kg/m. Wt Readings from Last 3 Encounters:  09/06/17 280 lb 3.2 oz (127.1 kg)  07/11/17 258 lb (117 kg)  04/06/16 287 lb 8 oz (130.4 kg)   Temp Readings from Last 3 Encounters:  09/06/17 98.2 F (36.8 C) (Oral)  07/11/17 98.3 F (36.8 C) (Oral)  04/06/16 98.4 F (36.9 C) (Oral)   BP Readings from Last 3 Encounters:  09/06/17 (!) 138/96  07/11/17 (!) 154/80  04/06/16 (!) 134/98   Pulse Readings from Last 3 Encounters:  09/06/17 94  07/11/17 100  04/06/16 88    Physical Exam  Constitutional: She is oriented to person, place, and time. Vital signs are normal. She appears well-developed and well-nourished.  HENT:  Head: Normocephalic and  atraumatic.  Mouth/Throat: Oropharynx is clear and moist and mucous membranes are normal.  Eyes: Pupils are equal, round, and reactive to light. Conjunctivae are normal.  Cardiovascular: Normal rate, regular rhythm and normal heart sounds.  Pulses:      Dorsalis pedis pulses are 2+ on the right side, and 2+ on the left side.       Posterior tibial pulses are 2+ on the right side, and 2+ on the left side.  Pulmonary/Chest: Effort normal and breath sounds normal.  Feet:  Right Foot:  Skin Integrity: Positive for warmth. Negative for ulcer, blister, skin breakdown, erythema, callus or dry skin.  Left Foot:  Skin Integrity: Positive for warmth. Negative for ulcer, blister, skin breakdown, erythema, callus or dry skin.  Neurological: She is alert and oriented to person, place, and time. Gait normal.  Skin: Skin is warm, dry and intact.  Psychiatric: She has a normal mood and affect. Her speech is normal and behavior is normal. Judgment and thought content normal. Cognition and memory are normal.  Nursing note and vitals reviewed.   Assessment   1. HTN improved  2. DM 2  3. HM  Plan  1. Refill lis 5 mg qd  2. Cont jardiance 10 mg qd, januvia 100 mg qd, metformin 500 mg qam  Foot exam today normal nevi left medial foot benign appearing, nevus right sole of foot benign appearing  Get copy of eye exam at [facility]  On ACEI, statin  3.  5. Declines flu shot, pna 23 vaccine  Tdap UTD per pt had 09/2022  Get records eye exam had spring 2018 [facility]  Check lipid in future as well as A1C, BMET Declines STD check  S/p hysterectomy age 32 y.o total  Referred for mammo today pt to sch    Provider: Dr. Olivia Mackie McLean-Scocuzza-Internal Medicine

## 2017-09-06 NOTE — Patient Instructions (Signed)
Please schedule mammogram  Please sch fasting labs 10/09/17 or after   Diabetes Mellitus and Nutrition When you have diabetes (diabetes mellitus), it is very important to have healthy eating habits because your blood sugar (glucose) levels are greatly affected by what you eat and drink. Eating healthy foods in the appropriate amounts, at about the same times every day, can help you:  Control your blood glucose.  Lower your risk of heart disease.  Improve your blood pressure.  Reach or maintain a healthy weight.  Every person with diabetes is different, and each person has different needs for a meal plan. Your health care provider may recommend that you work with a diet and nutrition specialist (dietitian) to make a meal plan that is best for you. Your meal plan may vary depending on factors such as:  The calories you need.  The medicines you take.  Your weight.  Your blood glucose, blood pressure, and cholesterol levels.  Your activity level.  Other health conditions you have, such as heart or kidney disease.  How do carbohydrates affect me? Carbohydrates affect your blood glucose level more than any other type of food. Eating carbohydrates naturally increases the amount of glucose in your blood. Carbohydrate counting is a method for keeping track of how many carbohydrates you eat. Counting carbohydrates is important to keep your blood glucose at a healthy level, especially if you use insulin or take certain oral diabetes medicines. It is important to know how many carbohydrates you can safely have in each meal. This is different for every person. Your dietitian can help you calculate how many carbohydrates you should have at each meal and for snack. Foods that contain carbohydrates include:  Bread, cereal, rice, pasta, and crackers.  Potatoes and corn.  Peas, beans, and lentils.  Milk and yogurt.  Fruit and juice.  Desserts, such as cakes, cookies, ice cream, and  candy.  How does alcohol affect me? Alcohol can cause a sudden decrease in blood glucose (hypoglycemia), especially if you use insulin or take certain oral diabetes medicines. Hypoglycemia can be a life-threatening condition. Symptoms of hypoglycemia (sleepiness, dizziness, and confusion) are similar to symptoms of having too much alcohol. If your health care provider says that alcohol is safe for you, follow these guidelines:  Limit alcohol intake to no more than 1 drink per day for nonpregnant women and 2 drinks per day for men. One drink equals 12 oz of beer, 5 oz of wine, or 1 oz of hard liquor.  Do not drink on an empty stomach.  Keep yourself hydrated with water, diet soda, or unsweetened iced tea.  Keep in mind that regular soda, juice, and other mixers may contain a lot of sugar and must be counted as carbohydrates.  What are tips for following this plan? Reading food labels  Start by checking the serving size on the label. The amount of calories, carbohydrates, fats, and other nutrients listed on the label are based on one serving of the food. Many foods contain more than one serving per package.  Check the total grams (g) of carbohydrates in one serving. You can calculate the number of servings of carbohydrates in one serving by dividing the total carbohydrates by 15. For example, if a food has 30 g of total carbohydrates, it would be equal to 2 servings of carbohydrates.  Check the number of grams (g) of saturated and trans fats in one serving. Choose foods that have low or no amount of these fats.  Check the number of milligrams (mg) of sodium in one serving. Most people should limit total sodium intake to less than 2,300 mg per day.  Always check the nutrition information of foods labeled as "low-fat" or "nonfat". These foods may be higher in added sugar or refined carbohydrates and should be avoided.  Talk to your dietitian to identify your daily goals for nutrients listed  on the label. Shopping  Avoid buying canned, premade, or processed foods. These foods tend to be high in fat, sodium, and added sugar.  Shop around the outside edge of the grocery store. This includes fresh fruits and vegetables, bulk grains, fresh meats, and fresh dairy. Cooking  Use low-heat cooking methods, such as baking, instead of high-heat cooking methods like deep frying.  Cook using healthy oils, such as olive, canola, or sunflower oil.  Avoid cooking with butter, cream, or high-fat meats. Meal planning  Eat meals and snacks regularly, preferably at the same times every day. Avoid going long periods of time without eating.  Eat foods high in fiber, such as fresh fruits, vegetables, beans, and whole grains. Talk to your dietitian about how many servings of carbohydrates you can eat at each meal.  Eat 4-6 ounces of lean protein each day, such as lean meat, chicken, fish, eggs, or tofu. 1 ounce is equal to 1 ounce of meat, chicken, or fish, 1 egg, or 1/4 cup of tofu.  Eat some foods each day that contain healthy fats, such as avocado, nuts, seeds, and fish. Lifestyle   Check your blood glucose regularly.  Exercise at least 30 minutes 5 or more days each week, or as told by your health care provider.  Take medicines as told by your health care provider.  Do not use any products that contain nicotine or tobacco, such as cigarettes and e-cigarettes. If you need help quitting, ask your health care provider.  Work with a Social worker or diabetes educator to identify strategies to manage stress and any emotional and social challenges. What are some questions to ask my health care provider?  Do I need to meet with a diabetes educator?  Do I need to meet with a dietitian?  What number can I call if I have questions?  When are the best times to check my blood glucose? Where to find more information:  American Diabetes Association: diabetes.org/food-and-fitness/food  Academy  of Nutrition and Dietetics: PokerClues.dk  [employer] of Diabetes and Digestive and Kidney Diseases (NIH): ContactWire.be Summary  A healthy meal plan will help you control your blood glucose and maintain a healthy lifestyle.  Working with a diet and nutrition specialist (dietitian) can help you make a meal plan that is best for you.  Keep in mind that carbohydrates and alcohol have immediate effects on your blood glucose levels. It is important to count carbohydrates and to use alcohol carefully. This information is not intended to replace advice given to you by your health care provider. Make sure you discuss any questions you have with your health care provider. Document Released: 02/10/2005 Document Revised: 06/20/2016 Document Reviewed: 06/20/2016 Elsevier Interactive Patient Education  [employer].

## 2017-10-09 ENCOUNTER — Other Ambulatory Visit: Payer: BLUE CROSS/BLUE SHIELD

## 2017-12-06 ENCOUNTER — Ambulatory Visit: Payer: BLUE CROSS/BLUE SHIELD | Admitting: Internal Medicine

## 2017-12-06 ENCOUNTER — Encounter: Payer: Self-pay | Admitting: Internal Medicine

## 2018-04-05 ENCOUNTER — Other Ambulatory Visit: Payer: Self-pay | Admitting: Internal Medicine

## 2018-04-05 DIAGNOSIS — I10 Essential (primary) hypertension: Secondary | ICD-10-CM

## 2018-05-09 ENCOUNTER — Other Ambulatory Visit: Payer: Self-pay | Admitting: Internal Medicine

## 2018-05-09 DIAGNOSIS — I10 Essential (primary) hypertension: Secondary | ICD-10-CM

## 2018-05-09 MED ORDER — LISINOPRIL 5 MG PO TABS: 5.0000 mg | ORAL_TABLET | Freq: Every day | ORAL | 0 refills | Status: DC

## 2018-08-08 ENCOUNTER — Other Ambulatory Visit: Payer: Self-pay

## 2018-08-08 DIAGNOSIS — I10 Essential (primary) hypertension: Secondary | ICD-10-CM

## 2018-08-08 MED ORDER — LISINOPRIL 5 MG PO TABS: 5.0000 mg | ORAL_TABLET | Freq: Every day | ORAL | 0 refills | Status: DC

## 2018-08-21 ENCOUNTER — Other Ambulatory Visit: Payer: Self-pay | Admitting: Internal Medicine

## 2018-08-21 DIAGNOSIS — E119 Type 2 diabetes mellitus without complications: Secondary | ICD-10-CM

## 2018-08-21 MED ORDER — METFORMIN HCL 500 MG PO TABS: ORAL_TABLET | ORAL | 0 refills | Status: DC

## 2018-08-27 ENCOUNTER — Telehealth: Payer: Self-pay

## 2018-08-27 ENCOUNTER — Telehealth: Payer: Self-pay | Admitting: Internal Medicine

## 2018-08-27 NOTE — Telephone Encounter (Signed)
Pt is trying to get [place] office thinking she may have yeast infection; did warm transfer to [facility].

## 2018-08-27 NOTE — Telephone Encounter (Signed)
Copied from CRM [id number]. Topic: Quick Communication - See Telephone Encounter >> Aug 27, 2018  4:05 PM Jens Som A wrote: CRM for notification. See Telephone encounter for: 08/27/18.  Patient declined ov. She is interested in virtual vist for vaginal itching Please advise [phone] (M)

## 2018-08-28 ENCOUNTER — Ambulatory Visit (INDEPENDENT_AMBULATORY_CARE_PROVIDER_SITE_OTHER): Payer: BLUE CROSS/BLUE SHIELD | Admitting: Internal Medicine

## 2018-08-28 ENCOUNTER — Other Ambulatory Visit: Payer: Self-pay

## 2018-08-28 ENCOUNTER — Encounter: Payer: Self-pay | Admitting: Internal Medicine

## 2018-08-28 DIAGNOSIS — E1165 Type 2 diabetes mellitus with hyperglycemia: Secondary | ICD-10-CM | POA: Diagnosis not present

## 2018-08-28 DIAGNOSIS — B373 Candidiasis of vulva and vagina: Secondary | ICD-10-CM | POA: Diagnosis not present

## 2018-08-28 DIAGNOSIS — E119 Type 2 diabetes mellitus without complications: Secondary | ICD-10-CM | POA: Diagnosis not present

## 2018-08-28 DIAGNOSIS — IMO0001 Reserved for inherently not codable concepts without codable children: Secondary | ICD-10-CM

## 2018-08-28 DIAGNOSIS — I10 Essential (primary) hypertension: Secondary | ICD-10-CM

## 2018-08-28 DIAGNOSIS — B3731 Acute candidiasis of vulva and vagina: Secondary | ICD-10-CM

## 2018-08-28 DIAGNOSIS — E785 Hyperlipidemia, unspecified: Secondary | ICD-10-CM

## 2018-08-28 MED ORDER — ATORVASTATIN CALCIUM 40 MG PO TABS: 40.0000 mg | ORAL_TABLET | Freq: Every day | ORAL | 0 refills | Status: DC

## 2018-08-28 MED ORDER — EMPAGLIFLOZIN 10 MG PO TABS: 10.0000 mg | ORAL_TABLET | Freq: Every day | ORAL | 0 refills | Status: DC

## 2018-08-28 MED ORDER — SITAGLIPTIN PHOSPHATE 100 MG PO TABS: 100.0000 mg | ORAL_TABLET | Freq: Every day | ORAL | 0 refills | Status: DC

## 2018-08-28 MED ORDER — FLUCONAZOLE 150 MG PO TABS: 150.0000 mg | ORAL_TABLET | Freq: Once | ORAL | 0 refills | Status: AC

## 2018-08-28 NOTE — Telephone Encounter (Signed)
Pt has tried OTC med no relief

## 2018-08-28 NOTE — Telephone Encounter (Signed)
appointment has been made

## 2018-08-28 NOTE — Patient Instructions (Signed)
Vaginal Yeast infection, Adult    Vaginal yeast infection is a condition that causes vaginal discharge as well as soreness, swelling, and redness (inflammation) of the vagina. This is a common condition. Some women get this infection frequently.  What are the causes?  This condition is caused by a change in the normal balance of the yeast (candida) and bacteria that live in the vagina. This change causes an overgrowth of yeast, which causes the inflammation.  What increases the risk?  The condition is more likely to develop in women who:   Take antibiotic medicines.   Have diabetes.   Take birth control pills.   Are pregnant.   Douche often.   Have a weak body defense system (immune system).   Have been taking steroid medicines for a long time.   Frequently wear tight clothing.  What are the signs or symptoms?  Symptoms of this condition include:   White, thick, creamy vaginal discharge.   Swelling, itching, redness, and irritation of the vagina. The lips of the vagina (vulva) may be affected as well.   Pain or a burning feeling while urinating.   Pain during sex.  How is this diagnosed?  This condition is diagnosed based on:   Your medical history.   A physical exam.   A pelvic exam. Your health care provider will examine a sample of your vaginal discharge under a microscope. Your health care provider may send this sample for testing to confirm the diagnosis.  How is this treated?  This condition is treated with medicine. Medicines may be over-the-counter or prescription. You may be told to use one or more of the following:   Medicine that is taken by mouth (orally).   Medicine that is applied as a cream (topically).   Medicine that is inserted directly into the vagina (suppository).  Follow these instructions at home:    Lifestyle   Do not have sex until your health care provider approves. Tell your sex partner that you have a yeast infection. That person should go to his or her health care  provider and ask if they should also be treated.   Do not wear tight clothes, such as pantyhose or tight pants.   Wear breathable cotton underwear.  General instructions   Take or apply over-the-counter and prescription medicines only as told by your health care provider.   Eat more yogurt. This may help to keep your yeast infection from returning.   Do not use tampons until your health care provider approves.   Try taking a sitz bath to help with discomfort. This is a warm water bath that is taken while you are sitting down. The water should only come up to your hips and should cover your buttocks. Do this 3-4 times per day or as told by your health care provider.   Do not douche.   If you have diabetes, keep your blood sugar levels under control.   Keep all follow-up visits as told by your health care provider. This is important.  Contact a health care provider if:   You have a fever.   Your symptoms go away and then return.   Your symptoms do not get better with treatment.   Your symptoms get worse.   You have new symptoms.   You develop blisters in or around your vagina.   You have blood coming from your vagina and it is not your menstrual period.   You develop pain in your abdomen.  Summary     Vaginal yeast infection is a condition that causes discharge as well as soreness, swelling, and redness (inflammation) of the vagina.   This condition is treated with medicine. Medicines may be over-the-counter or prescription.   Take or apply over-the-counter and prescription medicines only as told by your health care provider.   Do not douche. Do not have sex or use tampons until your health care provider approves.   Contact a health care provider if your symptoms do not get better with treatment or your symptoms go away and then return.  This information is not intended to replace advice given to you by your health care provider. Make sure you discuss any questions you have with your health care  provider.  Document Released: 02/23/2005 Document Revised: 10/02/2017 Document Reviewed: 10/02/2017  Elsevier Interactive Patient Education  2019 Elsevier Inc.

## 2018-08-28 NOTE — Progress Notes (Signed)
Virtual Visit via Video Note  I connected with Teresa Simpson on 08/28/18 at 11:30 AM EDT by a video enabled telemedicine application and verified that I am speaking with the correct person using two identifiers. Location patient: home Location provider:work  Persons participating in the virtual visit: patient, provider  I discussed the limitations of evaluation and management by telemedicine and the availability of in person appointments. The patient expressed understanding and agreed to proceed.   HPI: 1. Vaginal itching x 1 week no discharge, odor or new partners. Tried new soap oil of olay 2-3 weeks ago only new change tried monistate x 3 days w/o help overall though felt better at times. She is very uncomfortable  2. HTN on lis 5 mg qd pt is unable to tell me what her BP readings are and has not checked BP in 2 months   3. DM 2 A1C 2/12/9 was 10.4 on Januvia 100 mg qd, metformin 500 mg qam, Jardiance 10 mg, lipitor 40.  She has not had f/u since 08/2017 and emphasized f/u appt impt in 2-3 months after COVID 19 resolved as well need to monitor A1C, BP and need recent labs pt agreeable to call back to schedule.   ROS: See pertinent positives and negatives per HPI.  Past Medical History:  Diagnosis Date  . Allergy   . Asthma    childhood   . Diabetes mellitus without complication ([place])   . High blood pressure   . Kidney stones     Past Surgical History:  Procedure Laterality Date  . ABDOMINAL HYSTERECTOMY     age 48 ovary inflamed per pt had total hysterectomy  . APPENDECTOMY     appendicitis lead to scar tissue   . CHOLECYSTECTOMY    . EXPLORATORY LAPAROTOMY    . LAPAROSCOPY  2001    Family History  Problem Relation Age of Onset  . Alcohol abuse Father   . Diabetes Father   . Hypertension Father   . Breast cancer Maternal Aunt   . Breast cancer Paternal Aunt   . Diabetes Maternal Grandmother   . Colon cancer Maternal Grandmother   . Glaucoma Mother   . Cancer  Maternal Grandfather        colon cancer in 51s     SOCIAL HX: no change    Current Outpatient Medications:  .  atorvastatin (LIPITOR) 40 MG tablet, Take 1 tablet (40 mg total) by mouth daily., Disp: 90 tablet, Rfl: 0 .  blood glucose meter kit and supplies KIT, Dispense based on patient and insurance preference. Use up to three times daily as directed. E11.9, Disp: 1 each, Rfl: 0 .  empagliflozin (JARDIANCE) 10 MG TABS tablet, Take 10 mg by mouth daily., Disp: 90 tablet, Rfl: 0 .  fluconazole (DIFLUCAN) 150 MG tablet, Take 1 tablet (150 mg total) by mouth once for 1 dose. May repeat in 3 days if needed, Disp: 2 tablet, Rfl: 0 .  glucose blood test strip, Use as instructed.check bid to tid (am before breakfast, after lunch and dinner) E11.9, Disp: 270 each, Rfl: 3 .  Lancets (ACCU-CHEK MULTICLIX) lancets, Use as instructed check sugar 3x per day E11.9, Disp: 270 each, Rfl: 3 .  lisinopril (PRINIVIL,ZESTRIL) 5 MG tablet, Take 1 tablet (5 mg total) by mouth daily. appt further refills no exceptions call the office please, Disp: 90 tablet, Rfl: 0 .  metFORMIN (GLUCOPHAGE) 500 MG tablet, 1 pill qam with food appt further refills all meds no exceptions, Disp:  90 tablet, Rfl: 0 .  sitaGLIPtin (JANUVIA) 100 MG tablet, Take 1 tablet (100 mg total) by mouth daily., Disp: 90 tablet, Rfl: 0  EXAM:  VITALS per patient if applicable:  GENERAL: alert, oriented, appears well and in no acute distress  HEENT: atraumatic, conjunttiva clear, no obvious abnormalities on inspection of external nose and ears  NECK: normal movements of the head and neck  LUNGS: on inspection no signs of respiratory distress, breathing rate appears normal, no obvious gross SOB, gasping or wheezing  CV: no obvious cyanosis  MS: moves all visible extremities without noticeable abnormality  PSYCH/NEURO: pleasant and cooperative, no obvious depression or anxiety, speech and thought processing grossly intact  GU: unable to  inspect vaginal region with this viist   ASSESSMENT AND PLAN:  Discussed the following assessment and plan:   Yeast vaginitis - Plan: fluconazole (DIFLUCAN) 150 MG tablet x 2 doses 1 single dose today repeat dose in 3 days prn revisit in 2 days if not better call. rec milder soap Dove, vagisil/summers eve.   Hyperlipidemia, unspecified hyperlipidemia type - Plan: atorvastatin (LIPITOR) 40 MG tablet  Type 2 diabetes mellitus uncontrolled with hyperglycemia A1C 10.9 07/19/17, without long-term current use of insulin ([facility]) - Plan: atorvastatin (LIPITOR) 40 MG tablet, empagliflozin (JARDIANCE) 10 MG TABS tablet, sitaGLIPtin (JANUVIA) 100 MG tablet, metformin Pt does not think Jardiance 10 is causing yeast or UTI infections  ? Compliance with meds  Will need fasting labs and office visit asap once COVID resolved refilled all meds for now   HTN-on lisinopril 5 mg, encouraged pt to check her blood pressure consider change lisinopril to ARB/HCTZ combo at f/u with uncontrolled BP readings. Stressed compliance with meds  -need to do fasting labs asap when COVID resolved pt states she will call back and schedule      I discussed the assessment and treatment plan with the patient. The patient was provided an opportunity to ask questions and all were answered. The patient agreed with the plan and demonstrated an understanding of the instructions.   The patient was advised to call back or seek an in-person evaluation if the symptoms worsen or if the condition fails to improve as anticipated.  I provided 15 minutes of non-face-to-face time during this encounter.   Nino Glow McLean-Scocuzza, MD

## 2018-08-29 ENCOUNTER — Encounter: Payer: Self-pay | Admitting: Internal Medicine

## 2018-09-03 ENCOUNTER — Telehealth: Payer: Self-pay | Admitting: Family Medicine

## 2018-09-03 NOTE — Telephone Encounter (Signed)
Patient was instructed at appointment to schedule follow up in three months due to the recent outbreak.

## 2018-09-03 NOTE — Telephone Encounter (Signed)
Patient has no upcoming follow up  Please get her scheduled in the next few months  Due for:  A1c and other labs  HIV screen  Opthalmology exam for diabetes  Pneumonia vaccine

## 2018-09-05 ENCOUNTER — Telehealth: Payer: Self-pay | Admitting: Internal Medicine

## 2018-09-05 ENCOUNTER — Other Ambulatory Visit: Payer: Self-pay | Admitting: Internal Medicine

## 2018-09-05 DIAGNOSIS — Z1159 Encounter for screening for other viral diseases: Secondary | ICD-10-CM

## 2018-09-05 DIAGNOSIS — E559 Vitamin D deficiency, unspecified: Secondary | ICD-10-CM

## 2018-09-05 DIAGNOSIS — Z1329 Encounter for screening for other suspected endocrine disorder: Secondary | ICD-10-CM

## 2018-09-05 DIAGNOSIS — I10 Essential (primary) hypertension: Secondary | ICD-10-CM

## 2018-09-05 DIAGNOSIS — E1165 Type 2 diabetes mellitus with hyperglycemia: Secondary | ICD-10-CM

## 2018-09-05 DIAGNOSIS — Z0184 Encounter for antibody response examination: Secondary | ICD-10-CM

## 2018-09-05 NOTE — Telephone Encounter (Signed)
She needs to be fasting for labs x 8-12 nothing but water and meds   Thanks TMS

## 2018-09-05 NOTE — Telephone Encounter (Signed)
1. Please call pt  sch fasting lab visit asap   2. sch f/u appt 12/2018 with PCP in office   thanks TMS

## 2018-10-18 ENCOUNTER — Other Ambulatory Visit: Payer: Self-pay | Admitting: Internal Medicine

## 2018-10-18 DIAGNOSIS — I10 Essential (primary) hypertension: Secondary | ICD-10-CM

## 2018-10-18 MED ORDER — LISINOPRIL 5 MG PO TABS: 5.0000 mg | ORAL_TABLET | Freq: Every day | ORAL | 1 refills | Status: DC

## 2018-10-31 ENCOUNTER — Other Ambulatory Visit: Payer: BLUE CROSS/BLUE SHIELD

## 2018-11-06 ENCOUNTER — Telehealth: Payer: Self-pay

## 2018-11-06 NOTE — Telephone Encounter (Signed)
Copied from [place] [id number]. Topic: General - Other >> Nov 06, 2018  9:32 AM Valla Leaver wrote: Reason for JYN:[facility] nurse case manager, Mardene Celeste, notifying Dr. Aundra Dubin that patient has been enrolled in case management program. They assist with things like disease management education. Dr. Aundra Dubin can call  [phone] is she has any questions or concerns.

## 2018-12-11 ENCOUNTER — Other Ambulatory Visit: Payer: Self-pay | Admitting: Internal Medicine

## 2018-12-11 ENCOUNTER — Telehealth: Payer: Self-pay | Admitting: Internal Medicine

## 2018-12-11 DIAGNOSIS — IMO0001 Reserved for inherently not codable concepts without codable children: Secondary | ICD-10-CM

## 2018-12-11 DIAGNOSIS — E1165 Type 2 diabetes mellitus with hyperglycemia: Secondary | ICD-10-CM

## 2018-12-11 DIAGNOSIS — E785 Hyperlipidemia, unspecified: Secondary | ICD-10-CM

## 2018-12-11 DIAGNOSIS — E119 Type 2 diabetes mellitus without complications: Secondary | ICD-10-CM

## 2018-12-11 MED ORDER — METFORMIN HCL 500 MG PO TABS: ORAL_TABLET | ORAL | 0 refills | Status: DC

## 2018-12-11 MED ORDER — SITAGLIPTIN PHOSPHATE 100 MG PO TABS: 100.0000 mg | ORAL_TABLET | Freq: Every day | ORAL | 0 refills | Status: DC

## 2018-12-11 MED ORDER — ATORVASTATIN CALCIUM 40 MG PO TABS: 40.0000 mg | ORAL_TABLET | Freq: Every day | ORAL | 0 refills | Status: DC

## 2018-12-11 MED ORDER — EMPAGLIFLOZIN 10 MG PO TABS: 10.0000 mg | ORAL_TABLET | Freq: Every day | ORAL | 0 refills | Status: DC

## 2018-12-11 NOTE — Telephone Encounter (Signed)
Call pt she needs to schedule fasting labs before appt 01/09/2019 asap wearing a mask covering nose and mouth  TMS

## 2018-12-12 NOTE — Telephone Encounter (Signed)
mychart sent to patient

## 2019-01-09 ENCOUNTER — Ambulatory Visit: Payer: BLUE CROSS/BLUE SHIELD | Admitting: Internal Medicine

## 2019-01-09 DIAGNOSIS — Z0289 Encounter for other administrative examinations: Secondary | ICD-10-CM

## 2019-01-16 ENCOUNTER — Other Ambulatory Visit: Payer: Self-pay | Admitting: Internal Medicine

## 2019-01-16 ENCOUNTER — Telehealth: Payer: Self-pay | Admitting: Internal Medicine

## 2019-01-16 DIAGNOSIS — I10 Essential (primary) hypertension: Secondary | ICD-10-CM

## 2019-01-16 MED ORDER — LISINOPRIL 5 MG PO TABS: 5.0000 mg | ORAL_TABLET | Freq: Every day | ORAL | 0 refills | Status: DC

## 2019-01-16 NOTE — Telephone Encounter (Signed)
Call pt needs to sch fasting lab appt 1st and f/u asap

## 2019-01-18 ENCOUNTER — Telehealth: Payer: Self-pay | Admitting: Internal Medicine

## 2019-01-18 NOTE — Telephone Encounter (Signed)
I called pt and left msg to call ofc to sch Call pt needs to sch fasting lab appt 1st and f/u asap

## 2019-01-18 NOTE — Telephone Encounter (Signed)
See prior phone note. 

## 2019-01-18 NOTE — Telephone Encounter (Signed)
Pt needs to set up fasting lab appt asap and f/u appt after labs asap to get further refills on medications

## 2019-01-24 NOTE — Telephone Encounter (Signed)
Called pt to schedule. Unable to leave message due to voicemail being full

## 2019-04-02 ENCOUNTER — Telehealth: Payer: Self-pay | Admitting: Internal Medicine

## 2019-04-02 ENCOUNTER — Other Ambulatory Visit: Payer: Self-pay | Admitting: Internal Medicine

## 2019-04-02 DIAGNOSIS — E1165 Type 2 diabetes mellitus with hyperglycemia: Secondary | ICD-10-CM

## 2019-04-02 DIAGNOSIS — Z794 Long term (current) use of insulin: Secondary | ICD-10-CM

## 2019-04-02 DIAGNOSIS — I10 Essential (primary) hypertension: Secondary | ICD-10-CM

## 2019-04-02 DIAGNOSIS — E119 Type 2 diabetes mellitus without complications: Secondary | ICD-10-CM

## 2019-04-02 DIAGNOSIS — E785 Hyperlipidemia, unspecified: Secondary | ICD-10-CM

## 2019-04-02 MED ORDER — SITAGLIPTIN PHOSPHATE 100 MG PO TABS: 100.0000 mg | ORAL_TABLET | Freq: Every day | ORAL | 0 refills | Status: DC

## 2019-04-02 MED ORDER — LISINOPRIL 5 MG PO TABS: 5.0000 mg | ORAL_TABLET | Freq: Every day | ORAL | 0 refills | Status: DC

## 2019-04-02 MED ORDER — ATORVASTATIN CALCIUM 40 MG PO TABS: 40.0000 mg | ORAL_TABLET | Freq: Every day | ORAL | 0 refills | Status: DC

## 2019-04-02 MED ORDER — METFORMIN HCL 500 MG PO TABS: ORAL_TABLET | ORAL | 0 refills | Status: DC

## 2019-04-02 MED ORDER — EMPAGLIFLOZIN 10 MG PO TABS: 10.0000 mg | ORAL_TABLET | Freq: Every day | ORAL | 0 refills | Status: DC

## 2019-04-02 NOTE — Telephone Encounter (Signed)
Call pt needs to sch fasting labs asap and f/u appt  thanks [place]

## 2019-04-11 ENCOUNTER — Telehealth: Payer: Self-pay | Admitting: Internal Medicine

## 2019-04-11 NOTE — Telephone Encounter (Signed)
I called pt and left vm to call ofc to schedule fasting labs and follow up.

## 2019-05-03 DIAGNOSIS — N3001 Acute cystitis with hematuria: Secondary | ICD-10-CM | POA: Diagnosis not present

## 2019-05-07 ENCOUNTER — Other Ambulatory Visit: Payer: Self-pay | Admitting: Internal Medicine

## 2019-05-07 ENCOUNTER — Telehealth: Payer: Self-pay | Admitting: Internal Medicine

## 2019-05-07 DIAGNOSIS — E119 Type 2 diabetes mellitus without complications: Secondary | ICD-10-CM

## 2019-05-07 DIAGNOSIS — I10 Essential (primary) hypertension: Secondary | ICD-10-CM

## 2019-05-07 DIAGNOSIS — E785 Hyperlipidemia, unspecified: Secondary | ICD-10-CM

## 2019-05-07 DIAGNOSIS — Z794 Long term (current) use of insulin: Secondary | ICD-10-CM

## 2019-05-07 DIAGNOSIS — E1165 Type 2 diabetes mellitus with hyperglycemia: Secondary | ICD-10-CM

## 2019-05-07 MED ORDER — EMPAGLIFLOZIN 10 MG PO TABS: 10.0000 mg | ORAL_TABLET | Freq: Every day | ORAL | 0 refills | Status: DC

## 2019-05-07 MED ORDER — LISINOPRIL 5 MG PO TABS: 5.0000 mg | ORAL_TABLET | Freq: Every day | ORAL | 0 refills | Status: DC

## 2019-05-07 MED ORDER — METFORMIN HCL 500 MG PO TABS: ORAL_TABLET | ORAL | 0 refills | Status: DC

## 2019-05-07 MED ORDER — ATORVASTATIN CALCIUM 40 MG PO TABS: 40.0000 mg | ORAL_TABLET | Freq: Every day | ORAL | 0 refills | Status: DC

## 2019-05-07 MED ORDER — SITAGLIPTIN PHOSPHATE 100 MG PO TABS: 100.0000 mg | ORAL_TABLET | Freq: Every day | ORAL | 0 refills | Status: DC

## 2019-05-07 NOTE — Telephone Encounter (Signed)
Call pt sch fasting lab appt and f/u  Needs asap  Only 30 day supply of meds until both done  No labs since 06/2017

## 2019-05-14 DIAGNOSIS — Z20828 Contact with and (suspected) exposure to other viral communicable diseases: Secondary | ICD-10-CM | POA: Diagnosis not present

## 2019-06-06 ENCOUNTER — Other Ambulatory Visit: Payer: BC Managed Care – PPO

## 2019-06-11 ENCOUNTER — Encounter: Payer: Self-pay | Admitting: Internal Medicine

## 2019-06-11 ENCOUNTER — Ambulatory Visit: Payer: BC Managed Care – PPO | Admitting: Internal Medicine

## 2019-06-11 ENCOUNTER — Other Ambulatory Visit: Payer: Self-pay

## 2019-06-11 VITALS — BP 127/70 | Ht 70.0 in | Wt 280.0 lb

## 2019-06-11 DIAGNOSIS — I10 Essential (primary) hypertension: Secondary | ICD-10-CM

## 2019-06-11 DIAGNOSIS — E669 Obesity, unspecified: Secondary | ICD-10-CM

## 2019-06-11 DIAGNOSIS — E1165 Type 2 diabetes mellitus with hyperglycemia: Secondary | ICD-10-CM

## 2019-06-11 NOTE — Progress Notes (Signed)
Pt was on doxy at 11:27 informed will get to her at 11:45 on at 11:48 pt off doxy Called her 3x no answer she will need to reschedule but 1st schedule fasting labs asap  Then appt after labs I am unable to assess her health w/o labs no labs since 06/2017   Dr. [employer]

## 2019-06-12 ENCOUNTER — Telehealth: Payer: Self-pay | Admitting: Internal Medicine

## 2019-06-12 NOTE — Telephone Encounter (Signed)
I called pt to schedule Return for fasting labs asap and f/u after labs .vm was full. Mychart msg was sent.

## 2019-07-11 ENCOUNTER — Other Ambulatory Visit (INDEPENDENT_AMBULATORY_CARE_PROVIDER_SITE_OTHER): Payer: BC Managed Care – PPO

## 2019-07-11 ENCOUNTER — Other Ambulatory Visit: Payer: Self-pay

## 2019-07-11 DIAGNOSIS — I10 Essential (primary) hypertension: Secondary | ICD-10-CM

## 2019-07-11 DIAGNOSIS — E1165 Type 2 diabetes mellitus with hyperglycemia: Secondary | ICD-10-CM | POA: Diagnosis not present

## 2019-07-11 DIAGNOSIS — E559 Vitamin D deficiency, unspecified: Secondary | ICD-10-CM

## 2019-07-11 DIAGNOSIS — Z1329 Encounter for screening for other suspected endocrine disorder: Secondary | ICD-10-CM | POA: Diagnosis not present

## 2019-07-11 DIAGNOSIS — Z1159 Encounter for screening for other viral diseases: Secondary | ICD-10-CM | POA: Diagnosis not present

## 2019-07-11 DIAGNOSIS — Z0184 Encounter for antibody response examination: Secondary | ICD-10-CM

## 2019-07-11 LAB — CBC WITH DIFFERENTIAL/PLATELET
Basophils Absolute: 0 10*3/uL (ref 0.0–0.1)
Basophils Relative: 0.3 % (ref 0.0–3.0)
Eosinophils Absolute: 0.1 10*3/uL (ref 0.0–0.7)
Eosinophils Relative: 1 % (ref 0.0–5.0)
HCT: 43.1 % (ref 36.0–46.0)
Hemoglobin: 13.9 g/dL (ref 12.0–15.0)
Lymphocytes Relative: 42.2 % (ref 12.0–46.0)
Lymphs Abs: 3.4 10*3/uL (ref 0.7–4.0)
MCHC: 32.2 g/dL (ref 30.0–36.0)
MCV: 87.1 fl (ref 78.0–100.0)
Monocytes Absolute: 0.7 10*3/uL (ref 0.1–1.0)
Monocytes Relative: 8.4 % (ref 3.0–12.0)
Neutro Abs: 3.9 10*3/uL (ref 1.4–7.7)
Neutrophils Relative %: 48.1 % (ref 43.0–77.0)
Platelets: 300 10*3/uL (ref 150.0–400.0)
RBC: 4.95 Mil/uL (ref 3.87–5.11)
RDW: 13.9 % (ref 11.5–15.5)
WBC: 8.1 10*3/uL (ref 4.0–10.5)

## 2019-07-11 LAB — COMPREHENSIVE METABOLIC PANEL
ALT: 20 U/L (ref 0–35)
AST: 17 U/L (ref 0–37)
Albumin: 3.9 g/dL (ref 3.5–5.2)
Alkaline Phosphatase: 84 U/L (ref 39–117)
BUN: 13 mg/dL (ref 6–23)
CO2: 27 mEq/L (ref 19–32)
Calcium: 9.3 mg/dL (ref 8.4–10.5)
Chloride: 103 mEq/L (ref 96–112)
Creatinine, Ser: 0.72 mg/dL (ref 0.40–1.20)
GFR: 105.11 mL/min (ref >60.00)
Glucose, Bld: 189 mg/dL — ABNORMAL HIGH (ref 70–99)
Potassium: 3.9 mEq/L (ref 3.5–5.1)
Sodium: 136 mEq/L (ref 135–145)
Total Bilirubin: 0.8 mg/dL (ref 0.2–1.2)
Total Protein: 7.9 g/dL (ref 6.0–8.3)

## 2019-07-11 LAB — TSH: TSH: 1.73 u[IU]/mL (ref 0.35–4.50)

## 2019-07-11 LAB — LIPID PANEL
Cholesterol: 135 mg/dL (ref 0–200)
HDL: 54.9 mg/dL (ref >39.00)
LDL Cholesterol: 67 mg/dL (ref 0–99)
NonHDL: 80.3
Total CHOL/HDL Ratio: 2
Triglycerides: 68 mg/dL (ref 0.0–149.0)
VLDL: 13.6 mg/dL (ref 0.0–40.0)

## 2019-07-11 LAB — VITAMIN D 25 HYDROXY (VIT D DEFICIENCY, FRACTURES): VITD: 11.37 ng/mL — ABNORMAL LOW (ref 30.00–100.00)

## 2019-07-11 LAB — HEMOGLOBIN A1C: Hgb A1c MFr Bld: 12 % — ABNORMAL HIGH (ref 4.6–6.5)

## 2019-07-11 LAB — T4, FREE: Free T4: 0.99 ng/dL (ref 0.60–1.60)

## 2019-07-12 LAB — URINALYSIS, ROUTINE W REFLEX MICROSCOPIC
Bilirubin Urine: NEGATIVE
Hgb urine dipstick: NEGATIVE
Ketones, ur: NEGATIVE
Leukocytes,Ua: NEGATIVE
Nitrite: NEGATIVE
Protein, ur: NEGATIVE
Specific Gravity, Urine: 1.033 (ref 1.001–1.03)
pH: 5.5 (ref 5.0–8.0)

## 2019-07-12 LAB — MICROALBUMIN / CREATININE URINE RATIO
Creatinine, Urine: 107 mg/dL (ref 20–275)
Microalb Creat Ratio: 5 mcg/mg creat (ref <30)
Microalb, Ur: 0.5 mg/dL

## 2019-07-12 LAB — HEPATITIS B SURFACE ANTIBODY, QUANTITATIVE: Hep B S AB Quant (Post): <5 m[IU]/mL — ABNORMAL LOW (ref >=10)

## 2019-07-15 ENCOUNTER — Other Ambulatory Visit: Payer: Self-pay | Admitting: Internal Medicine

## 2019-07-15 ENCOUNTER — Encounter: Payer: Self-pay | Admitting: Internal Medicine

## 2019-07-15 DIAGNOSIS — E559 Vitamin D deficiency, unspecified: Secondary | ICD-10-CM | POA: Insufficient documentation

## 2019-07-15 MED ORDER — CHOLECALCIFEROL 1.25 MG (50000 UT) PO CAPS: 50000.0000 [IU] | ORAL_CAPSULE | ORAL | 1 refills | Status: DC

## 2019-07-15 NOTE — Progress Notes (Signed)
Left message to return call along with sending a mychart message.

## 2019-07-22 ENCOUNTER — Other Ambulatory Visit: Payer: Self-pay | Admitting: Internal Medicine

## 2019-07-22 DIAGNOSIS — E1165 Type 2 diabetes mellitus with hyperglycemia: Secondary | ICD-10-CM

## 2019-07-22 MED ORDER — OZEMPIC (0.25 OR 0.5 MG/DOSE) 2 MG/1.5ML ~~LOC~~ SOPN: 0.2500 mg | PEN_INJECTOR | SUBCUTANEOUS | 2 refills | Status: DC

## 2019-07-22 MED ORDER — PEN NEEDLES 30G X 8 MM MISC: 1.0000 | 11 refills | Status: AC

## 2019-07-30 ENCOUNTER — Other Ambulatory Visit: Payer: Self-pay

## 2019-07-30 ENCOUNTER — Encounter: Payer: Self-pay | Admitting: Family Medicine

## 2019-07-30 ENCOUNTER — Telehealth: Payer: Self-pay | Admitting: Internal Medicine

## 2019-07-30 ENCOUNTER — Telehealth (INDEPENDENT_AMBULATORY_CARE_PROVIDER_SITE_OTHER): Payer: BC Managed Care – PPO | Admitting: Family Medicine

## 2019-07-30 DIAGNOSIS — B372 Candidiasis of skin and nail: Secondary | ICD-10-CM

## 2019-07-30 MED ORDER — KETOCONAZOLE 2 % EX CREA: 1.0000 "application " | TOPICAL_CREAM | Freq: Every day | CUTANEOUS | 0 refills | Status: DC

## 2019-07-30 NOTE — Assessment & Plan Note (Signed)
Potentially intertrigo related.  We will trial topical ketoconazole.  If not improving in the next few days she will contact us for an in office visit.

## 2019-07-30 NOTE — Telephone Encounter (Signed)
Teresa Simpson, CMA  07/30/2019 4:39 PM EST    Patient voicemail box full. Mychart message sent.    Teresa Simpson, [place]  07/22/2019 11:29 AM EST    Left message to return call.    McLean-Scocuzza, Nino Glow, MD  07/22/2019 12:19 AM EST    Please sch pt f/u in 3-4 months after ozempic started    McLean-Scocuzza, Nino Glow, MD  07/22/2019 12:18 AM EST    Take trulicity off the office note and notate pt is supposed to bring in Rx ozempic she picks up from her pharmacy for teaching on 08/01/19  Thanks tMS   McLean-Scocuzza, Nino Glow, MD  07/22/2019 12:16 AM EST    Teresa Pina, RN to McLean-Scocuzza, Nino Glow, MD  Inform pt below she will need to pick up ozempic from her pharmacy and bring in for ozempic teaching appt 08/01/19  We cant give her samples if insurance will cover ozempic and believe they will    We have Ozempic in frig POD A that was given but patient cannot take, since patient insurance cover she can pick up script of [place] and bring to appointment   Teresa Simpson, [place]  07/17/2019 4:26 PM EST    Ozempic samples have been found I office by Juliann Pulse. Can disregard need for Trulicity prescription.    Teresa Pina, RN  07/17/2019 3:21 PM EST    We have Ozempic in frig POD A that was given by patient cannot take, since patient insurance cover she can pick up script of Ozempic and bring to appointment.   Teresa Simpson, [place]  07/17/2019 10:03 AM EST    Per patient's pharmacy Ozempic was preferred. There are no Ozempic samples in office so will do the Trulicity instead. Appointment already set for teaching, will add Trulicity to that office note.  I will confirm with patient's insurance whether Trulicity is covered.   Teresa Simpson, [place]  07/17/2019 10:02 AM EST    Per patient's pharmacy Ozempic was preferred. There are no Ozempic samples in office so will do the Trulicity instead. Appointment already set for teaching, will add Trulicity to that office note.  I  will confirm with patient's insurance whether Trulicity is covered.   McLean-Scocuzza, Nino Glow, MD  07/17/2019 6:50 AM EST    Can you label on appt injection teaching for ozempic 0.25 1x per week preferred if no ozempic samples ok to do trulicity 2.58 1x per week.   Can you show Teresa Simpson how to do this please Juliann Pulse?    Teresa Pina, RN  07/16/2019 1:04 PM EST    No you do not need to do anything Fransisco Simpson and Teresa Simpson can sign out for you and label medication.   McLean-Scocuzza, Nino Glow, MD  07/15/2019 5:18 PM EST    Thank you  I will be out of the office 3/4 but sample will need to be signed out disc with Juliann Pulse below   [facility] lets Rx ozempic sample at this time if no ozempic then we will have to give her trulicity sample to use 1x per week  Depending on sample either  1. Ozempic 0.25 1x per week  Or  2. Trulicity 5.27 1x per week   Pt to monitor sugar, does she have a meter?  Call back when sample is almost gone  Call her insurance and see which they will cover trulicity or ozempic and if insurance covers one or the other dispense sample which  insurance will cover   TMS   Tilford Pillar, [facility]  07/15/2019 4:58 PM EST    Explained this to the patient and got her to agree to the weekly injections.   Patient scheduled for a nurse visit 03/04 at 3:00 pm to come in and be taught to use the pens.    Tilford Pillar, CMA  07/15/2019 4:41 PM EST    Left message to return call along with sending a mychart message.    McLean-Scocuzza, [name], MD  07/15/2019 4:26 PM EST    Doing nothing is not an option b/c her A1C is 12  Goal is <7.0  She needs to act before diabetes starts causing harm to her organs  I would rec. Adding ozempic/trulicity 1x per week injection non insulin to her oral meds metformin and Jardiance  -we have samples in the clinic of ozempic/trulicity to show her how to do this, is she agreeable ?  Or  She can be referred to nutrition and endocrine=diabetes  doctor who is a specialist in diabetes   TMS   Tilford Pillar, CMA  07/15/2019 4:14 PM EST    Patient informed of her results and verbalized understanding.  Patient agreeable to stat Vitamin D supplement.   Patient states she does not like injections and declines Hep B vaccine for now.  Patient states she has not been taking her diabetic medication as she should have. She would like to hold off on both the insulin and weekly injections. Patient states she would like to try to work harder at bringing down Her A1C herself.   Patient would like to know how long you will give her as a trial period for her to do things the right way and see if her A1C comes down? Please advise    McLean-Scocuzza, [name], MD  07/15/2019 11:42 AM EST    Urine cloudy with sugar likely due to jardiance  Rec hep B vaccine x 2 doses 1 month apart new vaccine   A1C not controlled  She needs insulin, is she agreeable?  The other option would be to continue Jardiance and metformin and add ozempic or trulicity which is not insulin but a 1x/week injection If we did this we will need to stop Teresa Simpson  What would she like to do?  A1c 12.0 goal is <7.0   Vitamin D low sent 1x per week vitamin D to her pharmacy   Cholesterol normal  Liver kidneys normal but sugar elevated  Thyroid lab normal  Blood cts normal

## 2019-07-30 NOTE — Progress Notes (Signed)
Virtual Visit via telephone Note  This visit type was conducted due to national recommendations for restrictions regarding the COVID-19 pandemic (e.g. social distancing).  This format is felt to be most appropriate for this patient at this time.  All issues noted in this document were discussed and addressed.  No physical exam was performed (except for noted visual exam findings with Video Visits).   I connected with Teresa Simpson today at 12:30 PM EST by telephone and verified that I am speaking with the correct person using two identifiers. Location patient: car Location provider: work Persons participating in the virtual visit: patient, provider  I discussed the limitations, risks, security and privacy concerns of performing an evaluation and management service by telephone and the availability of in person appointments. I also discussed with the patient that there may be a patient responsible charge related to this service. The patient expressed understanding and agreed to proceed.  Interactive audio and video telecommunications were attempted between this provider and patient, however failed, due to patient having technical difficulties OR patient did not have access to video capability.  We continued and completed visit with audio only.   Reason for visit: same day visit  HPI: Labial itching: Patient notes this is been going on for 4 to 6 weeks.  She denies discharge and dysuria.  No odor.  No urine color change.  She is sexually active with one partner though this was going on prior to her most recent encounter and she does not think there is any issue there.  She will scratch and that has led to some discomfort with some minor skin breakage.  She is on Jardiance.   ROS: See pertinent positives and negatives per HPI.  Past Medical History:  Diagnosis Date  . Allergy   . Asthma    childhood   . Diabetes mellitus without complication ([place])   . High blood pressure   . Kidney  stones     Past Surgical History:  Procedure Laterality Date  . ABDOMINAL HYSTERECTOMY     age 55 ovary inflamed per pt had total hysterectomy  . APPENDECTOMY     appendicitis lead to scar tissue   . CHOLECYSTECTOMY    . EXPLORATORY LAPAROTOMY    . LAPAROSCOPY  2001    Family History  Problem Relation Age of Onset  . Alcohol abuse Father   . Diabetes Father   . Hypertension Father   . Breast cancer Maternal Aunt   . Breast cancer Paternal Aunt   . Diabetes Maternal Grandmother   . Colon cancer Maternal Grandmother   . Glaucoma Mother   . Cancer Maternal Grandfather        colon cancer in 6s     SOCIAL HX: Non-smoker   Current Outpatient Medications:  .  atorvastatin (LIPITOR) 40 MG tablet, Take 1 tablet (40 mg total) by mouth daily at 6 PM. appt further refills, Disp: 30 tablet, Rfl: 0 .  blood glucose meter kit and supplies KIT, Dispense based on patient and insurance preference. Use up to three times daily as directed. E11.9, Disp: 1 each, Rfl: 0 .  Cholecalciferol 1.25 MG (50000 UT) capsule, Take 1 capsule (50,000 Units total) by mouth once a week., Disp: 13 capsule, Rfl: 1 .  empagliflozin (JARDIANCE) 10 MG TABS tablet, Take 10 mg by mouth daily. Labs for further refills, Disp: 30 tablet, Rfl: 0 .  glucose blood test strip, Use as instructed.check bid to tid (am before breakfast, after lunch  and dinner) E11.9, Disp: 270 each, Rfl: 3 .  Insulin Pen Needle (PEN NEEDLES) 30G X 8 MM MISC, 1 Device by Does not apply route once a week., Disp: 30 each, Rfl: 11 .  Lancets (ACCU-CHEK MULTICLIX) lancets, Use as instructed check sugar 3x per day E11.9, Disp: 270 each, Rfl: 3 .  lisinopril (ZESTRIL) 5 MG tablet, Take 1 tablet (5 mg total) by mouth daily. appt further refills no exceptions call the office please, Disp: 30 tablet, Rfl: 0 .  metFORMIN (GLUCOPHAGE) 500 MG tablet, 1 pill qam with food lab appt further refills all meds no exceptions, Disp: 30 tablet, Rfl: 0 .   Semaglutide,0.25 or 0.5MG/DOS, (OZEMPIC, 0.25 OR 0.5 MG/DOSE,) 2 MG/1.5ML SOPN, Inject 0.25 mg into the skin once a week., Disp: 5 pen, Rfl: 2 .  sitaGLIPtin (JANUVIA) 100 MG tablet, Take 1 tablet (100 mg total) by mouth daily. appt further refills, Disp: 30 tablet, Rfl: 0 .  ketoconazole (NIZORAL) 2 % cream, Apply 1 application topically daily. Apply externally. Do not apply internally., Disp: 30 g, Rfl: 0  EXAM: This is a telehealth telephone visit and thus no physical exam was completed.  ASSESSMENT AND PLAN:  Discussed the following assessment and plan:  Candidal intertrigo Potentially intertrigo related.  We will trial topical ketoconazole.  If not improving in the next few days she will contact us for an in office visit.   No orders of the defined types were placed in this encounter.   Meds ordered this encounter  Medications  . ketoconazole (NIZORAL) 2 % cream    Sig: Apply 1 application topically daily. Apply externally. Do not apply internally.    Dispense:  30 g    Refill:  0     I discussed the assessment and treatment plan with the patient. The patient was provided an opportunity to ask questions and all were answered. The patient agreed with the plan and demonstrated an understanding of the instructions.   The patient was advised to call back or seek an in-person evaluation if the symptoms worsen or if the condition fails to improve as anticipated.  I provided 7 minutes of non-face-to-face time during this encounter.   Tommi Rumps, MD

## 2019-07-31 NOTE — Telephone Encounter (Signed)
Patient aware of nurse visit tomorrow 3/4 and time 3:00 pm. Patient has picked up her Ozempic from the pharmacy and will bring it with her tomorrow.

## 2019-08-01 ENCOUNTER — Ambulatory Visit: Payer: BC Managed Care – PPO

## 2019-08-01 ENCOUNTER — Other Ambulatory Visit: Payer: Self-pay

## 2019-08-01 DIAGNOSIS — E1165 Type 2 diabetes mellitus with hyperglycemia: Secondary | ICD-10-CM

## 2019-08-01 NOTE — Progress Notes (Addendum)
Patient came to learn how to give herself an injection with Ozempic. Patient was informed on how to do it then patient demonstrated in office by giving herself her first weekly Ozempic injection in the abdomen. Patient had no issues nor questions at this time.  Reviewed.  Dr Lorin Picket

## 2019-08-27 ENCOUNTER — Other Ambulatory Visit: Payer: Self-pay | Admitting: Internal Medicine

## 2019-08-27 DIAGNOSIS — I10 Essential (primary) hypertension: Secondary | ICD-10-CM

## 2019-08-27 MED ORDER — LISINOPRIL 5 MG PO TABS: 5.0000 mg | ORAL_TABLET | Freq: Every day | ORAL | 0 refills | Status: DC

## 2019-09-27 ENCOUNTER — Telehealth: Payer: Self-pay | Admitting: Internal Medicine

## 2019-09-27 DIAGNOSIS — I10 Essential (primary) hypertension: Secondary | ICD-10-CM

## 2019-09-27 MED ORDER — LISINOPRIL 5 MG PO TABS: 5.0000 mg | ORAL_TABLET | Freq: Every day | ORAL | 0 refills | Status: DC

## 2019-09-27 NOTE — Telephone Encounter (Signed)
Pt is out of lisinopril (ZESTRIL) 5 MG tablet. Please send to [facility]

## 2019-10-29 ENCOUNTER — Other Ambulatory Visit: Payer: Self-pay

## 2019-10-29 ENCOUNTER — Other Ambulatory Visit: Payer: Self-pay | Admitting: Internal Medicine

## 2019-10-29 ENCOUNTER — Telehealth: Payer: Self-pay | Admitting: Internal Medicine

## 2019-10-29 ENCOUNTER — Telehealth: Payer: Self-pay

## 2019-10-29 DIAGNOSIS — E785 Hyperlipidemia, unspecified: Secondary | ICD-10-CM

## 2019-10-29 DIAGNOSIS — I10 Essential (primary) hypertension: Secondary | ICD-10-CM

## 2019-10-29 DIAGNOSIS — E1165 Type 2 diabetes mellitus with hyperglycemia: Secondary | ICD-10-CM

## 2019-10-29 DIAGNOSIS — E119 Type 2 diabetes mellitus without complications: Secondary | ICD-10-CM

## 2019-10-29 MED ORDER — EMPAGLIFLOZIN 10 MG PO TABS: 10.0000 mg | ORAL_TABLET | Freq: Every day | ORAL | 0 refills | Status: DC

## 2019-10-29 MED ORDER — LISINOPRIL 5 MG PO TABS: 5.0000 mg | ORAL_TABLET | Freq: Every day | ORAL | 0 refills | Status: DC

## 2019-10-29 MED ORDER — METFORMIN HCL 500 MG PO TABS: ORAL_TABLET | ORAL | 0 refills | Status: AC

## 2019-10-29 MED ORDER — ATORVASTATIN CALCIUM 40 MG PO TABS: 40.0000 mg | ORAL_TABLET | Freq: Every day | ORAL | 0 refills | Status: DC

## 2019-10-29 MED ORDER — OZEMPIC (0.25 OR 0.5 MG/DOSE) 2 MG/1.5ML ~~LOC~~ SOPN: 0.5000 mg | PEN_INJECTOR | SUBCUTANEOUS | 0 refills | Status: DC

## 2019-10-29 NOTE — Telephone Encounter (Signed)
Will not fill meds until schedules and keeps appt for fasting labs and keeps f/u in person  She has yet to do this  TMS

## 2019-10-29 NOTE — Telephone Encounter (Signed)
Pt called a refill on sitaGLIPtin (JANUVIA) 100 MG tablet  ,Semaglutide,0.25 or 0.5MG /DOS, (OZEMPIC, 0.25 OR 0.5 ,  lisinopril (ZESTRIL) 5 MG tablet

## 2019-10-29 NOTE — Telephone Encounter (Signed)
Have her increase ozempic to 0.5 1x per week stop [place] continue jardiance and metformin and other meds sent in 10/29/19 and keep f/u 11/06/19   Thanks tMS

## 2019-10-29 NOTE — Telephone Encounter (Signed)
Patient has been scheduled for 11/06/19 at 9:30 in office. She will come fasting for this appointment to have lab work done.   Patient was seen in January and had labs done February. She would like a temporary script sent in to last her until 11/06/19 as she states she is completely out of these medications.   Okay to send in?

## 2019-10-29 NOTE — Telephone Encounter (Signed)
 [facility] [place] [facility] - Cl TELEPHONE ADVICE RECORD [facility] Patient Name: Teresa Simpson Gender: Female DOB: May 15, 1971 Age: 49 Y 2 M 3 D Return Phone Number: Address: City/State/Zip:  [facility] [place] [facility] - [facility] Client Site  [facility] [place] - [facility] Contact Type Call Who Is Calling Pharmacy Call Type Pharmacy Send to RN Chief Complaint Prescription Refill or Medication Request (non symptomatic) Reason for Call Request to speak to Physician Initial Comment Caller is pharmacy requesting a refill of Lisinopril for a pt. Additional Comment Pharmacy Name [facility] Pharmacist Name [facility] Number [phone] Translation No Nurse Assessment Nurse: Orinda Kenner Date/Time Lamount Cohen Time): 10/26/2019 5:05:17 PM Confirm and document reason for call. If symptomatic, describe symptoms. ---Caller is pharmacy requesting a refill of Lisinopril 5mg  for pt. They have already provided her with a loaner dose of medication. They state she has enough to last until the office opens. Has the patient had close contact with a person known or suspected to have the novel coronavirus illness OR traveled / lives in area with major community spread (including international travel) in the last 14 days from the onset of symptoms? * If Asymptomatic, screen for exposure and travel within the last 14 days. ---No Does the patient have any new or worsening symptoms? ---No Please document clinical information provided and list any resource used. ---Recommended they follow up with the office Tuesday. Guidelines Guideline Title Affirmed Question Affirmed Notes Nurse Date/Time (Eastern Time) Disp. Time Thursday Time) Disposition Final User 10/26/2019 5:06:25 PM Clinical Call Yes 10/28/2019

## 2019-10-30 NOTE — Telephone Encounter (Signed)
Called and informed the Patient of the below.  She will increase the Ozempic. Patient states she is not taking the Jardiance as she had constant reoccurring yeast infections and declines re-starting the med. She is currently taking the Januvia and the Metformin.   Please advise

## 2019-10-30 NOTE — Telephone Encounter (Signed)
If increasing ozempic stop [place]  Place jardiance on allergy list   TMS

## 2019-10-30 NOTE — Telephone Encounter (Signed)
Patient informed and verbalized understanding

## 2019-11-05 ENCOUNTER — Other Ambulatory Visit: Payer: Self-pay

## 2019-11-06 ENCOUNTER — Ambulatory Visit: Payer: BC Managed Care – PPO | Admitting: Internal Medicine

## 2019-12-05 ENCOUNTER — Telehealth: Payer: Self-pay | Admitting: Internal Medicine

## 2019-12-05 ENCOUNTER — Ambulatory Visit: Payer: BC Managed Care – PPO | Admitting: Internal Medicine

## 2019-12-05 DIAGNOSIS — Z0289 Encounter for other administrative examinations: Secondary | ICD-10-CM

## 2019-12-05 NOTE — Telephone Encounter (Signed)
Will be dismissing pt from my practice   Dr. French Ana McLean-Scocuzza

## 2019-12-05 NOTE — Telephone Encounter (Signed)
Patient is noncompliant misses multiple appts I would like to dismiss

## 2019-12-09 ENCOUNTER — Other Ambulatory Visit: Payer: Self-pay | Admitting: Internal Medicine

## 2019-12-09 DIAGNOSIS — E1165 Type 2 diabetes mellitus with hyperglycemia: Secondary | ICD-10-CM

## 2019-12-09 DIAGNOSIS — E119 Type 2 diabetes mellitus without complications: Secondary | ICD-10-CM

## 2019-12-09 DIAGNOSIS — E785 Hyperlipidemia, unspecified: Secondary | ICD-10-CM

## 2019-12-09 MED ORDER — ATORVASTATIN CALCIUM 40 MG PO TABS: 40.0000 mg | ORAL_TABLET | Freq: Every day | ORAL | 0 refills | Status: DC

## 2019-12-19 ENCOUNTER — Encounter: Payer: Self-pay | Admitting: Internal Medicine

## 2019-12-26 NOTE — Telephone Encounter (Signed)
Dismissal letter and activation form has been sent to HIM for processing.

## 2019-12-27 ENCOUNTER — Telehealth: Payer: Self-pay | Admitting: Internal Medicine

## 2019-12-27 NOTE — Telephone Encounter (Signed)
Patient dismissed from [facility] by Quentin Ore, MD effective 12/19/19. Dismissal Letter sent out by 1st class mail. [employer]

## 2020-01-03 ENCOUNTER — Other Ambulatory Visit: Payer: Self-pay | Admitting: Internal Medicine

## 2020-01-03 DIAGNOSIS — E119 Type 2 diabetes mellitus without complications: Secondary | ICD-10-CM

## 2020-01-03 DIAGNOSIS — I10 Essential (primary) hypertension: Secondary | ICD-10-CM

## 2020-01-03 MED ORDER — LISINOPRIL 5 MG PO TABS: 5.0000 mg | ORAL_TABLET | Freq: Every day | ORAL | 0 refills | Status: DC

## 2020-03-12 DIAGNOSIS — N760 Acute vaginitis: Secondary | ICD-10-CM | POA: Diagnosis not present

## 2020-03-26 DIAGNOSIS — E1169 Type 2 diabetes mellitus with other specified complication: Secondary | ICD-10-CM | POA: Diagnosis not present

## 2020-03-26 DIAGNOSIS — E785 Hyperlipidemia, unspecified: Secondary | ICD-10-CM | POA: Diagnosis not present

## 2020-03-26 DIAGNOSIS — I10 Essential (primary) hypertension: Secondary | ICD-10-CM | POA: Diagnosis not present

## 2020-03-26 DIAGNOSIS — E669 Obesity, unspecified: Secondary | ICD-10-CM | POA: Diagnosis not present

## 2021-04-13 ENCOUNTER — Emergency Department
Admission: EM | Admit: 2021-04-13 | Discharge: 2021-04-13 | Disposition: A | Discharge status: Home / Self Care | Payer: BC Managed Care – PPO | Attending: Emergency Medicine | Admitting: Emergency Medicine

## 2021-04-13 ENCOUNTER — Other Ambulatory Visit: Payer: Self-pay

## 2021-04-13 DIAGNOSIS — R739 Hyperglycemia, unspecified: Secondary | ICD-10-CM

## 2021-04-13 DIAGNOSIS — Z79899 Other long term (current) drug therapy: Secondary | ICD-10-CM | POA: Diagnosis not present

## 2021-04-13 DIAGNOSIS — Z7984 Long term (current) use of oral hypoglycemic drugs: Secondary | ICD-10-CM | POA: Insufficient documentation

## 2021-04-13 DIAGNOSIS — Z794 Long term (current) use of insulin: Secondary | ICD-10-CM | POA: Insufficient documentation

## 2021-04-13 DIAGNOSIS — E1165 Type 2 diabetes mellitus with hyperglycemia: Secondary | ICD-10-CM | POA: Diagnosis not present

## 2021-04-13 DIAGNOSIS — J45909 Unspecified asthma, uncomplicated: Secondary | ICD-10-CM | POA: Insufficient documentation

## 2021-04-13 DIAGNOSIS — I10 Essential (primary) hypertension: Secondary | ICD-10-CM | POA: Insufficient documentation

## 2021-04-13 LAB — COMPREHENSIVE METABOLIC PANEL
ALT: 20 U/L (ref 0–44)
AST: 20 U/L (ref 15–41)
Albumin: 4 g/dL (ref 3.5–5.0)
Alkaline Phosphatase: 108 U/L (ref 38–126)
Anion gap: 8 (ref 5–15)
BUN: 15 mg/dL (ref 6–20)
CO2: 25 mmol/L (ref 22–32)
Calcium: 9.2 mg/dL (ref 8.9–10.3)
Chloride: 97 mmol/L — ABNORMAL LOW (ref 98–111)
Creatinine, Ser: 0.7 mg/dL (ref 0.44–1.00)
GFR, Estimated: >60 mL/min (ref >60)
Glucose, Bld: 411 mg/dL — ABNORMAL HIGH (ref 70–99)
Potassium: 4.1 mmol/L (ref 3.5–5.1)
Sodium: 130 mmol/L — ABNORMAL LOW (ref 135–145)
Total Bilirubin: 1.2 mg/dL (ref 0.3–1.2)
Total Protein: 8.8 g/dL — ABNORMAL HIGH (ref 6.5–8.1)

## 2021-04-13 LAB — CBC WITH DIFFERENTIAL/PLATELET
Abs Immature Granulocytes: 0.02 10*3/uL (ref 0.00–0.07)
Basophils Absolute: 0 10*3/uL (ref 0.0–0.1)
Basophils Relative: 0 %
Eosinophils Absolute: 0.1 10*3/uL (ref 0.0–0.5)
Eosinophils Relative: 1 %
HCT: 43.6 % (ref 36.0–46.0)
Hemoglobin: 14.3 g/dL (ref 12.0–15.0)
Immature Granulocytes: 0 %
Lymphocytes Relative: 48 %
Lymphs Abs: 5.2 10*3/uL — ABNORMAL HIGH (ref 0.7–4.0)
MCH: 28.3 pg (ref 26.0–34.0)
MCHC: 32.8 g/dL (ref 30.0–36.0)
MCV: 86.2 fL (ref 80.0–100.0)
Monocytes Absolute: 0.8 10*3/uL (ref 0.1–1.0)
Monocytes Relative: 7 %
Neutro Abs: 4.9 10*3/uL (ref 1.7–7.7)
Neutrophils Relative %: 44 %
Platelets: 350 10*3/uL (ref 150–400)
RBC: 5.06 MIL/uL (ref 3.87–5.11)
RDW: 12 % (ref 11.5–15.5)
WBC: 11 10*3/uL — ABNORMAL HIGH (ref 4.0–10.5)
nRBC: 0 % (ref 0.0–0.2)

## 2021-04-13 LAB — TROPONIN I (HIGH SENSITIVITY): Troponin I (High Sensitivity): 4 ng/L (ref <18)

## 2021-04-13 LAB — CBG MONITORING, ED: Glucose-Capillary: 377 mg/dL — ABNORMAL HIGH (ref 70–99)

## 2021-04-13 NOTE — Progress Notes (Signed)
   04/13/21 1400  Clinical Encounter Type  Visited With Patient  Visit Type Initial;Spiritual support;Social support  Referral From Other (Comment) (rounding)  Chaplain Burris checked-in on Pt's well being. Pt shared she has had some concerns after turning 50 due to loss of father's life at same age, Luna Fuse validated those feelings and encouraged Pt not to minimize regard for health but to continue to check-in with her body and prioritize self-care. We affirmed role of faith and chaplain expressed hope for her continued health.

## 2021-04-13 NOTE — Discharge Instructions (Signed)
Please try to limit your carbohydrate intake and take your metformin as directed.  You should follow-up with your primary care provider as your medications may need to be adjusted.

## 2021-04-13 NOTE — ED Provider Notes (Signed)
Emergency Medicine Provider Triage Evaluation Note  Teresa Simpson , a 50 y.o. female  was evaluated in triage.  Pt complains of elevated glucose. Checked at work and was 540. She feels unsteady and flushed intermittently.  Review of Systems  Positive: Dizziness Negative: Nausea, vomiting, polyuria, blurred vision  Physical Exam  There were no vitals taken for this visit. Gen:   Awake, no distress   Resp:  Normal effort  MSK:   Moves extremities without difficulty  Other:    Medical Decision Making  Medically screening exam initiated at 11:00 AM.  Appropriate orders placed.  Teresa Simpson was informed that the remainder of the evaluation will be completed by another provider, this initial triage assessment does not replace that evaluation, and the importance of remaining in the ED until their evaluation is complete.   Chinita Pester, FNP 04/13/21 1257    Gilles Chiquito, MD 04/13/21 1314

## 2021-04-13 NOTE — ED Triage Notes (Signed)
Pt to ED for hyperglycemia. Diabetic, takes metformin. States was feeling off at work "hot flashes" Cbg 377

## 2021-04-13 NOTE — ED Notes (Addendum)
Pt states that she has been "cheating" states that she hasn't been eating right and hasn't been taking her medication correctly either, states that she sometimes doesn't take it in the morning either if she has a meeting due to the way it affects her stomach, pt states that she feels much better now.

## 2021-04-13 NOTE — ED Provider Notes (Signed)
[facility]  ____________________________________________   Event Date/Time   First MD Initiated Contact with Patient 04/13/21 1318     (approximate)  I have reviewed the triage vital signs and the nursing notes.   HISTORY  Chief Complaint Hyperglycemia    HPI Teresa Simpson is a 50 y.o. female with type 2DM who presents with hyperglycemia. Patient was feeling slightly off today while at work, describes a sensation of warmth and "airy feeling" coming over her. She did not synopsize. Co-worker checked her BG and it was in the 500s so she came to the ED. Patient currently feels back to baseline. Denies CP, SOB, abdominal pain, N/V, dysuria. Shehas been thirsty. She is on metformin but has missed doses lately. She also notes that she has been stress eating sugary foods. BG at home on a good day are in the 200s. Patient was also prescribed a once weekly injectable med that she does not remember the name of, but stopped taking it bc it was expensive and she had recently bought a car and was trying to save money.          Past Medical History:  Diagnosis Date   Allergy    Asthma    childhood    Diabetes mellitus without complication ([place])    High blood pressure    Kidney stones     Patient Active Problem List   Diagnosis Date Noted   Candidal intertrigo 07/30/2019   Vitamin D deficiency 07/15/2019   Snoring 07/11/2017   Obesity, Class II, BMI 35-39.9 07/11/2017   Uncontrolled diabetes mellitus 04/06/2016   Essential hypertension 04/06/2016    Past Surgical History:  Procedure Laterality Date   ABDOMINAL HYSTERECTOMY     age 40 ovary inflamed per pt had total hysterectomy   APPENDECTOMY     appendicitis lead to scar tissue    CHOLECYSTECTOMY     EXPLORATORY LAPAROTOMY     LAPAROSCOPY  2001    Prior to Admission medications   Medication Sig Start Date End Date Taking? Authorizing Provider  atorvastatin (LIPITOR) 40 MG tablet Take 1 tablet  (40 mg total) by mouth daily at 6 PM. No further refills pt will have to establish with new PCP noncompliant 12/09/19   McLean-Scocuzza, Nino Glow, MD  blood glucose meter kit and supplies KIT Dispense based on patient and insurance preference. Use up to three times daily as directed. E11.9 07/11/17   McLean-Scocuzza, Nino Glow, MD  Cholecalciferol 1.25 MG (50000 UT) capsule Take 1 capsule (50,000 Units total) by mouth once a week. 07/15/19   McLean-Scocuzza, Nino Glow, MD  glucose blood test strip Use as instructed.check bid to tid (am before breakfast, after lunch and dinner) E11.9 09/06/17   McLean-Scocuzza, Nino Glow, MD  Insulin Pen Needle (PEN NEEDLES) 30G X 8 MM MISC 1 Device by Does not apply route once a week. 07/22/19   McLean-Scocuzza, Nino Glow, MD  ketoconazole (NIZORAL) 2 % cream Apply 1 application topically daily. Apply externally. Do not apply internally. 07/30/19   Leone Haven, MD  Lancets (ACCU-CHEK MULTICLIX) lancets Use as instructed check sugar 3x per day E11.9 07/11/17   McLean-Scocuzza, Nino Glow, MD  lisinopril (ZESTRIL) 5 MG tablet Take 1 tablet (5 mg total) by mouth daily. Pt will need to establish with new PCP no further refills 01/03/20   McLean-Scocuzza, Nino Glow, MD  metFORMIN (GLUCOPHAGE) 500 MG tablet 1 pill qam with food lab appt further refills all meds no exceptions  10/29/19   McLean-Scocuzza, Nino Glow, MD  Semaglutide,0.25 or 0.5MG/DOS, (OZEMPIC, 0.25 OR 0.5 MG/DOSE,) 2 MG/1.5ML SOPN Inject 0.5 mg into the skin once a week. 10/29/19   McLean-Scocuzza, Nino Glow, MD    Allergies Jardiance [empagliflozin]  Family History  Problem Relation Age of Onset   Alcohol abuse Father    Diabetes Father    Hypertension Father    Breast cancer Maternal Aunt    Breast cancer Paternal Aunt    Diabetes Maternal Grandmother    Colon cancer Maternal Grandmother    Glaucoma Mother    Cancer Maternal Grandfather        colon cancer in 58s     Social History Social History   Tobacco Use    Smoking status: Never   Smokeless tobacco: Never  Substance Use Topics   Alcohol use: Yes   Drug use: No    Review of Systems   Review of Systems  Constitutional:  Negative for appetite change, chills and fever.  Respiratory:  Negative for chest tightness and shortness of breath.   Cardiovascular:  Negative for chest pain.  Gastrointestinal:  Negative for abdominal pain, nausea and vomiting.  Endocrine: Negative for polydipsia and polyuria.  Genitourinary:  Negative for dysuria.  Neurological:  Negative for syncope.  All other systems reviewed and are negative.  Physical Exam Updated Vital Signs BP (!) 122/96 (BP Location: Right Arm)   Pulse (!) 104   Temp 97.8 F (36.6 C) (Oral)   Resp 18   Ht '5\' 10"'  (1.778 m)   Wt 121.1 kg   SpO2 100%   BMI 38.31 kg/m   Physical Exam Vitals and nursing note reviewed.  Constitutional:      General: She is not in acute distress.    Appearance: Normal appearance.  HENT:     Head: Normocephalic and atraumatic.     Mouth/Throat:     Mouth: Mucous membranes are moist.  Eyes:     General: No scleral icterus.    Conjunctiva/sclera: Conjunctivae normal.  Cardiovascular:     Rate and Rhythm: Normal rate and regular rhythm.  Pulmonary:     Effort: Pulmonary effort is normal. No respiratory distress.     Breath sounds: Normal breath sounds. No wheezing.  Abdominal:     General: Abdomen is flat.     Palpations: Abdomen is soft.  Musculoskeletal:        General: No deformity or signs of injury.     Cervical back: Normal range of motion.  Skin:    Coloration: Skin is not jaundiced or pale.  Neurological:     General: No focal deficit present.     Mental Status: She is alert and oriented to person, place, and time. Mental status is at baseline.  Psychiatric:        Mood and Affect: Mood normal.        Behavior: Behavior normal.     LABS (all labs ordered are listed, but only abnormal results are displayed)  Labs Reviewed   COMPREHENSIVE METABOLIC PANEL - Abnormal; Notable for the following components:      Result Value   Sodium 130 (*)    Chloride 97 (*)    Glucose, Bld 411 (*)    Total Protein 8.8 (*)    All other components within normal limits  CBC WITH DIFFERENTIAL/PLATELET - Abnormal; Notable for the following components:   WBC 11.0 (*)    Lymphs Abs 5.2 (*)    All other components  within normal limits  CBG MONITORING, ED - Abnormal; Notable for the following components:   Glucose-Capillary 377 (*)    All other components within normal limits  URINALYSIS, COMPLETE (UACMP) WITH MICROSCOPIC  TROPONIN I (HIGH SENSITIVITY)   ____________________________________________  EKG  NSR, nml axis, nml intervals, no acute ischemic changes  ____________________________________________  RADIOLOGY Almeta Monas, personally viewed and evaluated these images (plain radiographs) as part of my medical decision making, as well as reviewing the written report by the radiologist.  ED MD interpretation:      ____________________________________________   PROCEDURES  Procedure(s) performed (including Critical Care):  Procedures   ____________________________________________   INITIAL IMPRESSION / ASSESSMENT AND PLAN / ED COURSE   50 yo female presenting with hyperglycemia. BG initially in the 500s, but relatively asymptomatic other than feeling warm/presyncopal? While at work. She is asymptomatic now, no symptoms of infection or ischemia. Patient has had dietary indiscretions and is non-compliant with her metformin and injectable agent which I believe to be the triggers for her hyperglycemia. Labs do not show e/o DKA no AG, nml bicarb, pt is mentating normally. On repeat, BG is 377. Given her reassuring labs and overall appearance, I think she is stable for dc home. I stressed the importance of f/u with PCP and med compliance due to the harms of long term hyperglycemia.       ____________________________________________   FINAL CLINICAL IMPRESSION(S) / ED DIAGNOSES  Final diagnoses:  Hyperglycemia     ED Discharge Orders     None        Note:  This document was prepared using Dragon voice recognition software and may include unintentional dictation errors.    Rada Hay, MD 04/13/21 1754

## 2023-08-03 ENCOUNTER — Other Ambulatory Visit: Payer: Self-pay

## 2023-08-08 ENCOUNTER — Encounter: Payer: Self-pay | Admitting: Ophthalmology

## 2023-08-09 NOTE — Discharge Instructions (Addendum)

## 2023-08-15 ENCOUNTER — Encounter: Payer: Self-pay | Admitting: Ophthalmology

## 2023-08-15 ENCOUNTER — Encounter
Admission: RE | Disposition: A | Discharge status: Home / Self Care | Payer: Self-pay | Source: Ambulatory Visit | Attending: Ophthalmology

## 2023-08-15 ENCOUNTER — Ambulatory Visit: Payer: Self-pay | Admitting: Anesthesiology

## 2023-08-15 ENCOUNTER — Ambulatory Visit
Admission: RE | Admit: 2023-08-15 | Discharge: 2023-08-15 | Disposition: A | Discharge status: Home / Self Care | Payer: BC Managed Care – PPO | Source: Ambulatory Visit | Attending: Ophthalmology | Admitting: Ophthalmology

## 2023-08-15 ENCOUNTER — Other Ambulatory Visit: Payer: Self-pay

## 2023-08-15 DIAGNOSIS — Z538 Procedure and treatment not carried out for other reasons: Secondary | ICD-10-CM | POA: Diagnosis not present

## 2023-08-15 DIAGNOSIS — H269 Unspecified cataract: Secondary | ICD-10-CM | POA: Diagnosis present

## 2023-08-15 DIAGNOSIS — I10 Essential (primary) hypertension: Secondary | ICD-10-CM | POA: Diagnosis not present

## 2023-08-15 LAB — GLUCOSE, CAPILLARY
Glucose-Capillary: 314 mg/dL — ABNORMAL HIGH (ref 70–99)
Glucose-Capillary: 294 mg/dL — ABNORMAL HIGH (ref 70–99)

## 2023-08-15 SURGERY — PHACOEMULSIFICATION, CATARACT, WITH IOL INSERTION
Anesthesia: Monitor Anesthesia Care | Laterality: Left

## 2023-08-15 MED ORDER — ARMC OPHTHALMIC DILATING DROPS
OPHTHALMIC | Status: AC
  Filled 2023-08-15: qty 0.5

## 2023-08-15 MED ORDER — HYDRALAZINE HCL 20 MG/ML IJ SOLN
2.0000 mg | Freq: Once | INTRAMUSCULAR | Status: AC
  Administered 2023-08-15: 2 mg via INTRAVENOUS

## 2023-08-15 MED ORDER — TETRACAINE HCL 0.5 % OP SOLN
OPHTHALMIC | Status: AC
  Filled 2023-08-15: qty 4

## 2023-08-15 MED ORDER — INSULIN REGULAR HUMAN 100 UNIT/ML IJ SOLN
INTRAMUSCULAR | Status: AC
  Filled 2023-08-15: qty 5

## 2023-08-15 MED ORDER — INSULIN REGULAR HUMAN 100 UNIT/ML IJ SOLN: 5.0000 [IU] | Freq: Once | INTRAMUSCULAR | Status: DC

## 2023-08-15 MED ORDER — TETRACAINE HCL 0.5 % OP SOLN
1.0000 [drp] | OPHTHALMIC | Status: DC | PRN
  Administered 2023-08-15: 1 [drp] via OPHTHALMIC

## 2023-08-15 MED ORDER — FENTANYL CITRATE (PF) 100 MCG/2ML IJ SOLN
INTRAMUSCULAR | Status: AC
  Filled 2023-08-15: qty 2

## 2023-08-15 MED ORDER — INSULIN REGULAR HUMAN 100 UNIT/ML IJ SOLN
5.0000 [IU] | Freq: Once | INTRAMUSCULAR | Status: AC
  Administered 2023-08-15: 5 [IU] via INTRAVENOUS

## 2023-08-15 MED ORDER — ARMC OPHTHALMIC DILATING DROPS: 1.0000 | OPHTHALMIC | Status: DC | PRN

## 2023-08-15 MED ORDER — HYDRALAZINE HCL 20 MG/ML IJ SOLN
INTRAMUSCULAR | Status: AC
  Filled 2023-08-15: qty 1

## 2023-08-15 MED ORDER — MIDAZOLAM HCL 2 MG/2ML IJ SOLN
INTRAMUSCULAR | Status: AC
  Filled 2023-08-15: qty 2

## 2023-08-15 SURGICAL SUPPLY — 12 items
CANNULA ANT/CHMB 27GA (MISCELLANEOUS) IMPLANT
CATARACT SUITE SIGHTPATH (MISCELLANEOUS) ×1 IMPLANT
CYSTOTOME ANG REV CUT SHRT 25G (CUTTER) ×1 IMPLANT
GLOVE BIOGEL PI IND STRL 8 (GLOVE) ×1 IMPLANT
GLOVE SURG LX STRL 8.0 MICRO (GLOVE) ×1 IMPLANT
GLOVE SURG PROTEXIS BL SZ6.5 (GLOVE) ×1 IMPLANT
GLOVE SURG SYN 8.0 (GLOVE) IMPLANT
NEEDLE FILTER BLUNT 18X1 1/2 (NEEDLE) ×1 IMPLANT
PACK VIT ANT 23G (MISCELLANEOUS) IMPLANT
RING MALYGIN (MISCELLANEOUS) IMPLANT
SUT ETHILON 10-0 CS-B-6CS-B-6 (SUTURE) IMPLANT
SYR 3ML LL SCALE MARK (SYRINGE) ×1 IMPLANT

## 2023-08-15 NOTE — Anesthesia Preprocedure Evaluation (Addendum)
 Anesthesia Evaluation  Patient identified by MRN, date of birth, ID band Patient awake    Reviewed: Allergy & Precautions, H&P , NPO status , Patient's Chart, lab work & pertinent test results  Airway Mallampati: I  TM Distance: >3 FB Neck ROM: Full    Dental no notable dental hx. (+) Chipped   Pulmonary neg pulmonary ROS, asthma    Pulmonary exam normal breath sounds clear to auscultation       Cardiovascular hypertension, Normal cardiovascular exam Rhythm:Regular Rate:Normal     Neuro/Psych negative neurological ROS  negative psych ROS   GI/Hepatic negative GI ROS, Neg liver ROS,,,  Endo/Other  diabetes    Renal/GU Renal diseasenegative Renal ROS  negative genitourinary   Musculoskeletal negative musculoskeletal ROS (+)    Abdominal   Peds negative pediatric ROS (+)  Hematology negative hematology ROS (+)   Anesthesia Other Findings   Diabetes mellitus without complication per history. Patient is on metformin, blood sugar over 300 today.  Allergy Kidney stones  High blood pressure not on meds, uncontrolled HTN Asthma   Discussed w/patient that blood sugar and BP are much too high, needs to go to her doctor and receive treatment. Discussed risks of uncontrolled hypertension and uncontrolled hyperglycemia.  [facility] nurse, Morrie Sheldon, Attempted to call patient physician so I could speak with physician. Vara Guardian on 20 +minutes, unable to get anyone on phone, patient states she will go straight to office as soon as she leaves ASC.  Patient seems very reliable and understands what we have communicated (and gave her handouts) on risks of uncontrolled blood sugar and uncontrolled HTN.    Reproductive/Obstetrics negative OB ROS                             Anesthesia Physical Anesthesia Plan  ASA: 3  Anesthesia Plan: MAC   Post-op Pain Management:    Induction: Intravenous  PONV Risk Score  and Plan:   Airway Management Planned: Natural Airway and Nasal Cannula  Additional Equipment:   Intra-op Plan:   Post-operative Plan:   Informed Consent: I have reviewed the patients History and Physical, chart, labs and discussed the procedure including the risks, benefits and alternatives for the proposed anesthesia with the patient or authorized representative who has indicated his/her understanding and acceptance.     Dental Advisory Given  Plan Discussed with: Anesthesiologist, CRNA and Surgeon  Anesthesia Plan Comments: (Patient consented for risks of anesthesia including but not limited to:  - adverse reactions to medications - damage to eyes, teeth, lips or other oral mucosa - nerve damage due to positioning  - sore throat or hoarseness - Damage to heart, brain, nerves, lungs, other parts of body or loss of life  Patient voiced understanding and assent.)        Anesthesia Quick Evaluation

## 2023-08-15 NOTE — Progress Notes (Addendum)
 Uncontrolled HTN and blood sugar.  See record of pressures and blood sugars.  Denies headache, dizziness, nausea, double or blurred vision. States she "feels fine" despite BP with diastolic 119.  Blood sugars over 300.  Treated HTN with low doses hydralazine as do not want to severely drop BP; patient's brain and body have adjusted to this as a new normal; need to decrease carefully.  However, pressure came done moderately with and came right back up.  Blood sugars responded moderately.  Patient will go today to her physician and get BP and glucose under better control.  Gave handouts and discussed risks of uncontrolled HTN and glucose.  Patient seems extremely reliable. Will cancel cataract surgery today and return when HTN and blood sugars under better control.

## 2023-08-15 NOTE — H&P (Signed)
 [facility]   Primary Care Physician:  [facility] Ophthalmologist: Dr. Druscilla Brownie  Pre-Procedure History & Physical: HPI:  Teresa Simpson is a 53 y.o. female here for cataract surgery.   Past Medical History:  Diagnosis Date   Allergy    Asthma    childhood    Diabetes mellitus without complication ([facility])    High blood pressure    Kidney stones     Past Surgical History:  Procedure Laterality Date   ABDOMINAL HYSTERECTOMY     age 45 ovary inflamed per pt had total hysterectomy   APPENDECTOMY     appendicitis lead to scar tissue    CHOLECYSTECTOMY     EXPLORATORY LAPAROTOMY     LAPAROSCOPY  2001    Prior to Admission medications   Medication Sig Start Date End Date Taking? Authorizing Provider  metFORMIN (GLUCOPHAGE) 500 MG tablet 1 pill qam with food lab appt further refills all meds no exceptions 10/29/19  Yes McLean-Scocuzza, [name], MD  blood glucose meter kit and supplies KIT Dispense based on patient and insurance preference. Use up to three times daily as directed. E11.9 07/11/17   McLean-Scocuzza, [name], MD  glucose blood test strip Use as instructed.check bid to tid (am before breakfast, after lunch and dinner) E11.9 09/06/17   McLean-Scocuzza, [name], MD  Insulin Pen Needle (PEN NEEDLES) 30G X 8 MM MISC 1 Device by Does not apply route once a week. 07/22/19   McLean-Scocuzza, [name], MD  Lancets (ACCU-CHEK MULTICLIX) lancets Use as instructed check sugar 3x per day E11.9 07/11/17   McLean-Scocuzza, [name], MD    Allergies as of 07/25/2023 - Review Complete 04/13/2021  Allergen Reaction Noted   Jardiance [empagliflozin] Other (See Comments) 10/30/2019    Family History  Problem Relation Age of Onset   Alcohol abuse Father    Diabetes Father    Hypertension Father    Breast cancer Maternal Aunt    Breast cancer Paternal Aunt    Diabetes Maternal Grandmother    Colon cancer Maternal Grandmother    Glaucoma Mother    Cancer Maternal  Grandfather        colon cancer in 35s     Social History   Socioeconomic History   Marital status: Single    Spouse name: Not on file   Number of children: Not on file   Years of education: Not on file   Highest education level: Not on file  Occupational History   Not on file  Tobacco Use   Smoking status: Never   Smokeless tobacco: Never  Vaping Use   Vaping status: Never Used  Substance and Sexual Activity   Alcohol use: Not Currently   Drug use: No   Sexual activity: Yes    Partners: Male  Other Topics Concern   Not on file  Social History Narrative   1 brother    Nieces and nephews    Social Drivers of Corporate investment banker Strain: Not on file  Food Insecurity: Not on file  Transportation Needs: Not on file  Physical Activity: Not on file  Stress: Not on file  Social Connections: Not on file  Intimate Partner Violence: Not on file    Review of Systems: See HPI, otherwise negative ROS  Physical Exam: BP (!) 175/116   Pulse (!) 116   Temp 99.1 F (37.3 C)   Ht 5\' 10"  (1.778 m)   Wt 111.1 kg   SpO2 Marland Kitchen)  2%   BMI 35.15 kg/m  General:   Alert, cooperative in NAD Head:  Normocephalic and atraumatic. Respiratory:  Normal work of breathing. Cardiovascular:  RRR  Impression/Plan: Teresa Simpson is here for cataract surgery.  Risks, benefits, limitations, and alternatives regarding cataract surgery have been reviewed with the patient.  Questions have been answered.  All parties agreeable.   Galen Manila, MD  08/15/2023, 7:56 AM

## 2023-08-24 NOTE — Anesthesia Preprocedure Evaluation (Signed)
 Anesthesia Evaluation    Airway        Dental   Pulmonary           Cardiovascular hypertension,      Neuro/Psych    GI/Hepatic   Endo/Other  diabetes    Renal/GU      Musculoskeletal   Abdominal   Peds  Hematology   Anesthesia Other Findings Previous cataract surgery 08-15-23 Dr. Juel Burrow anesthesioloigist  Reproductive/Obstetrics                             Anesthesia Physical Anesthesia Plan Anesthesia Quick Evaluation

## 2023-08-29 ENCOUNTER — Encounter: Payer: Self-pay | Admitting: Anesthesiology

## 2023-08-29 ENCOUNTER — Encounter: Admission: RE | Payer: Self-pay | Source: Home / Self Care

## 2023-08-29 ENCOUNTER — Ambulatory Visit: Admission: RE | Admit: 2023-08-29 | Payer: BC Managed Care – PPO | Source: Home / Self Care | Admitting: Ophthalmology

## 2023-08-29 SURGERY — PHACOEMULSIFICATION, CATARACT, WITH IOL INSERTION
Anesthesia: Topical | Laterality: Right

## 2023-09-04 ENCOUNTER — Encounter: Payer: Self-pay | Admitting: Ophthalmology

## 2023-09-05 NOTE — Anesthesia Preprocedure Evaluation (Addendum)
 Anesthesia Evaluation  Patient identified by MRN, date of birth, ID band Patient awake    Reviewed: Allergy & Precautions, H&P , NPO status , Patient's Chart, lab work & pertinent test results  Airway Mallampati: II  TM Distance: >3 FB Neck ROM: Full    Dental no notable dental hx.    Pulmonary neg pulmonary ROS   Pulmonary exam normal breath sounds clear to auscultation       Cardiovascular hypertension, Normal cardiovascular exam Rhythm:Regular Rate:Normal     Neuro/Psych negative neurological ROS  negative psych ROS   GI/Hepatic negative GI ROS, Neg liver ROS,,,  Endo/Other  diabetes    Renal/GU negative Renal ROS  negative genitourinary   Musculoskeletal negative musculoskeletal ROS (+)    Abdominal   Peds negative pediatric ROS (+)  Hematology negative hematology ROS (+)   Anesthesia Other Findings First cataract surgery 08-15-23 Dr. Aldo Amble Second time canceled 04--01-25 Previously cancelled due to hyperglycemia, blood sugars 400 or more  Diabetes mellitus, poorly controlled Allergy Kidney stones  High blood pressure Asthma     Reproductive/Obstetrics negative OB ROS                             Anesthesia Physical Anesthesia Plan  ASA: 3  Anesthesia Plan: MAC   Post-op Pain Management:    Induction: Intravenous  PONV Risk Score and Plan:   Airway Management Planned: Natural Airway and Nasal Cannula  Additional Equipment:   Intra-op Plan:   Post-operative Plan:   Informed Consent: I have reviewed the patients History and Physical, chart, labs and discussed the procedure including the risks, benefits and alternatives for the proposed anesthesia with the patient or authorized representative who has indicated his/her understanding and acceptance.     Dental Advisory Given  Plan Discussed with: Anesthesiologist, CRNA and Surgeon  Anesthesia Plan Comments:  (Patient consented for risks of anesthesia including but not limited to:  - adverse reactions to medications - damage to eyes, teeth, lips or other oral mucosa - nerve damage due to positioning  - sore throat or hoarseness - Damage to heart, brain, nerves, lungs, other parts of body or loss of life  Patient voiced understanding and assent.)       Anesthesia Quick Evaluation

## 2023-09-08 NOTE — Discharge Instructions (Signed)

## 2023-09-12 ENCOUNTER — Other Ambulatory Visit: Payer: Self-pay

## 2023-09-12 ENCOUNTER — Encounter: Payer: Self-pay | Admitting: Ophthalmology

## 2023-09-12 ENCOUNTER — Ambulatory Visit: Payer: Self-pay | Admitting: Anesthesiology

## 2023-09-12 ENCOUNTER — Encounter
Admission: RE | Disposition: A | Discharge status: Home / Self Care | Payer: Self-pay | Source: Ambulatory Visit | Attending: Ophthalmology

## 2023-09-12 ENCOUNTER — Ambulatory Visit
Admission: RE | Admit: 2023-09-12 | Discharge: 2023-09-12 | Disposition: A | Discharge status: Home / Self Care | Source: Ambulatory Visit | Attending: Ophthalmology | Admitting: Ophthalmology

## 2023-09-12 DIAGNOSIS — I10 Essential (primary) hypertension: Secondary | ICD-10-CM | POA: Diagnosis not present

## 2023-09-12 DIAGNOSIS — H2512 Age-related nuclear cataract, left eye: Secondary | ICD-10-CM | POA: Insufficient documentation

## 2023-09-12 DIAGNOSIS — E1136 Type 2 diabetes mellitus with diabetic cataract: Secondary | ICD-10-CM | POA: Insufficient documentation

## 2023-09-12 LAB — GLUCOSE, CAPILLARY
Glucose-Capillary: 236 mg/dL — ABNORMAL HIGH (ref 70–99)
Glucose-Capillary: 242 mg/dL — ABNORMAL HIGH (ref 70–99)

## 2023-09-12 SURGERY — PHACOEMULSIFICATION, CATARACT, WITH IOL INSERTION
Anesthesia: Monitor Anesthesia Care | Site: Eye | Laterality: Left

## 2023-09-12 MED ORDER — MIDAZOLAM HCL 2 MG/2ML IJ SOLN
INTRAMUSCULAR | Status: DC | PRN
  Administered 2023-09-12 (×2): 1 mg via INTRAVENOUS

## 2023-09-12 MED ORDER — SIGHTPATH DOSE#1 BSS IO SOLN
INTRAOCULAR | Status: DC | PRN
  Administered 2023-09-12: 15 mL via INTRAOCULAR

## 2023-09-12 MED ORDER — BRIMONIDINE TARTRATE-TIMOLOL 0.2-0.5 % OP SOLN
OPHTHALMIC | Status: DC | PRN
  Administered 2023-09-12: 1 [drp] via OPHTHALMIC

## 2023-09-12 MED ORDER — TETRACAINE HCL 0.5 % OP SOLN
OPHTHALMIC | Status: AC
  Filled 2023-09-12: qty 4

## 2023-09-12 MED ORDER — SIGHTPATH DOSE#1 BSS IO SOLN: INTRAOCULAR | Status: DC | PRN

## 2023-09-12 MED ORDER — FENTANYL CITRATE (PF) 100 MCG/2ML IJ SOLN
INTRAMUSCULAR | Status: DC | PRN
Start: 2023-09-12 — End: 2023-09-12
  Administered 2023-09-12 (×2): 50 ug via INTRAVENOUS

## 2023-09-12 MED ORDER — LIDOCAINE HCL (PF) 2 % IJ SOLN
INTRAOCULAR | Status: DC | PRN
  Administered 2023-09-12: 2 mL

## 2023-09-12 MED ORDER — SIGHTPATH DOSE#1 NA CHONDROIT SULF-NA HYALURON 40-17 MG/ML IO SOLN
INTRAOCULAR | Status: DC | PRN
  Administered 2023-09-12: 1 mL via INTRAOCULAR

## 2023-09-12 MED ORDER — ARMC OPHTHALMIC DILATING DROPS
OPHTHALMIC | Status: AC
  Filled 2023-09-12: qty 0.5

## 2023-09-12 MED ORDER — MIDAZOLAM HCL 2 MG/2ML IJ SOLN
INTRAMUSCULAR | Status: AC
  Filled 2023-09-12: qty 2

## 2023-09-12 MED ORDER — TETRACAINE HCL 0.5 % OP SOLN
1.0000 [drp] | OPHTHALMIC | Status: DC | PRN
  Administered 2023-09-12 (×3): 1 [drp] via OPHTHALMIC

## 2023-09-12 MED ORDER — INSULIN REGULAR HUMAN 100 UNIT/ML IJ SOLN
5.0000 [IU] | Freq: Once | INTRAMUSCULAR | Status: AC
  Administered 2023-09-12: 5 [IU] via INTRAVENOUS

## 2023-09-12 MED ORDER — FENTANYL CITRATE (PF) 100 MCG/2ML IJ SOLN
INTRAMUSCULAR | Status: AC
  Filled 2023-09-12: qty 2

## 2023-09-12 MED ORDER — ARMC OPHTHALMIC DILATING DROPS
1.0000 | OPHTHALMIC | Status: DC | PRN
  Administered 2023-09-12 (×3): 1 via OPHTHALMIC

## 2023-09-12 MED ORDER — MOXIFLOXACIN HCL 0.5 % OP SOLN
OPHTHALMIC | Status: DC | PRN
  Administered 2023-09-12: .2 mL via OPHTHALMIC

## 2023-09-12 SURGICAL SUPPLY — 13 items
CATARACT SUITE SIGHTPATH (MISCELLANEOUS) ×1 IMPLANT
CYSTOTOME ANG REV CUT SHRT 25G (CUTTER) ×1 IMPLANT
CYSTOTOME ANGL RVRS SHRT 25G (CUTTER) ×1 IMPLANT
CYSTOTOME ANGL RVRS SHRT 25GA (CUTTER) ×1 IMPLANT
FEE CATARACT SUITE SIGHTPATH (MISCELLANEOUS) ×1 IMPLANT
GLOVE BIOGEL PI IND STRL 8 (GLOVE) ×1 IMPLANT
GLOVE SURG LX STRL 8.0 MICRO (GLOVE) ×1 IMPLANT
GLOVE SURG PROTEXIS BL SZ6.5 (GLOVE) ×1 IMPLANT
GLOVE SURG SYN 6.5 PF PI BL (GLOVE) ×1 IMPLANT
LENS IOL TECNIS EYHANCE 11.5 (Intraocular Lens) IMPLANT
NDL FILTER BLUNT 18X1 1/2 (NEEDLE) ×1 IMPLANT
NEEDLE FILTER BLUNT 18X1 1/2 (NEEDLE) ×1 IMPLANT
SYR 3ML LL SCALE MARK (SYRINGE) ×1 IMPLANT

## 2023-09-12 NOTE — Transfer of Care (Signed)
 Immediate Anesthesia Transfer of Care Note  Patient: Teresa Simpson  Procedure(s) Performed: PHACOEMULSIFICATION, CATARACT, WITH IOL INSERTION 7.06 00:47.6 (Left: Eye)  Patient Location: PACU  Anesthesia Type: MAC  Level of Consciousness: awake, alert  and patient cooperative  Airway and Oxygen Therapy: Patient Spontanous Breathing and Patient connected to supplemental oxygen  Post-op Assessment: Post-op Vital signs reviewed, Patient's Cardiovascular Status Stable, Respiratory Function Stable, Patent Airway and No signs of Nausea or vomiting  Post-op Vital Signs: Reviewed and stable  Complications: No notable events documented.

## 2023-09-12 NOTE — Anesthesia Postprocedure Evaluation (Signed)
 Anesthesia Post Note  Patient: Anndee Connett Sanger  Procedure(s) Performed: PHACOEMULSIFICATION, CATARACT, WITH IOL INSERTION 7.06 00:47.6 (Left: Eye)  Patient location during evaluation: PACU Anesthesia Type: MAC Level of consciousness: awake and alert Pain management: pain level controlled Vital Signs Assessment: post-procedure vital signs reviewed and stable Respiratory status: spontaneous breathing, nonlabored ventilation, respiratory function stable and patient connected to nasal cannula oxygen Cardiovascular status: stable and blood pressure returned to baseline Postop Assessment: no apparent nausea or vomiting Anesthetic complications: no   No notable events documented.   Last Vitals:  Vitals:   09/12/23 1010 09/12/23 1015  BP: (!) 131/92   Pulse: 96   Temp: 36.7 C 36.7 C  SpO2:      Last Pain:  Vitals:   09/12/23 1015  TempSrc:   PainSc: 0-No pain                 Yolande Skoda C Douglas Rooks

## 2023-09-12 NOTE — Op Note (Signed)
 PREOPERATIVE DIAGNOSIS:  Nuclear sclerotic cataract of the left eye.   POSTOPERATIVE DIAGNOSIS:  Nuclear sclerotic cataract of the left eye.   OPERATIVE PROCEDURE:ORPROCALL@   SURGEON:  Clair Crews, MD.   ANESTHESIA:  Anesthesiologist: Emilie Harden, MD CRNA: Jahoo, Sonia, CRNA  1.      Managed anesthesia care. 2.     0.47ml of Shugarcaine was instilled following the paracentesis   COMPLICATIONS:  None.   TECHNIQUE:   Stop and chop   DESCRIPTION OF PROCEDURE:  The patient was examined and consented in the preoperative holding area where the aforementioned topical anesthesia was applied to the left eye and then brought back to the Operating Room where the left eye was prepped and draped in the usual sterile ophthalmic fashion and a lid speculum was placed. A paracentesis was created with the side port blade and the anterior chamber was filled with viscoelastic. A near clear corneal incision was performed with the steel keratome. A continuous curvilinear capsulorrhexis was performed with a cystotome followed by the capsulorrhexis forceps. Hydrodissection and hydrodelineation were carried out with BSS on a blunt cannula. The lens was removed in a stop and chop  technique and the remaining cortical material was removed with the irrigation-aspiration handpiece. The capsular bag was inflated with viscoelastic and the Technis [id number] lens was placed in the capsular bag without complication. The remaining viscoelastic was removed from the eye with the irrigation-aspiration handpiece. The wounds were hydrated. The anterior chamber was flushed with BSS and the eye was inflated to physiologic pressure. 0.40ml Vigamox was placed in the anterior chamber. The wounds were found to be water tight. The eye was dressed with Combigan. The patient was given protective glasses to wear throughout the day and a shield with which to sleep tonight. The patient was also given drops with which to begin a drop regimen  today and will follow-up with me in one day. Implant Name Type Inv. Item Serial No. Manufacturer Lot No. LRB No. Used Action  LENS IOL TECNIS EYHANCE 11.5 - [id number] Intraocular Lens LENS IOL TECNIS EYHANCE 11.5 [id number] [employer]  Left 1 Implanted    Procedure(s): PHACOEMULSIFICATION, CATARACT, WITH IOL INSERTION 7.06 00:47.6 (Left)  Electronically signed: Clair Crews 09/12/2023 10:10 AM

## 2023-09-12 NOTE — H&P (Signed)
 Teresa Simpson   Primary Care Physician:  [facility] Ophthalmologist: Dr. Jeb Miner  Pre-Procedure History & Physical: HPI:  Teresa Simpson is a 53 y.o. female here for cataract surgery.   Past Medical History:  Diagnosis Date   Allergy    Asthma    childhood    Diabetes mellitus without complication (HCC)    High blood pressure    Kidney stones     Past Surgical History:  Procedure Laterality Date   ABDOMINAL HYSTERECTOMY     age 36 ovary inflamed per pt had total hysterectomy   APPENDECTOMY     appendicitis lead to scar tissue    CHOLECYSTECTOMY     EXPLORATORY LAPAROTOMY     LAPAROSCOPY  2001    Prior to Admission medications   Medication Sig Start Date End Date Taking? Authorizing Provider  atorvastatin (LIPITOR) 40 MG tablet Take 40 mg by mouth daily.   Yes [provider]  blood glucose meter kit and supplies KIT Dispense based on patient and insurance preference. Use up to three times daily as directed. E11.9 07/11/17  Yes McLean-Scocuzza, Karon Packer, MD  glucose blood test strip Use as instructed.check bid to tid (am before breakfast, after lunch and dinner) E11.9 09/06/17  Yes McLean-Scocuzza, Karon Packer, MD  hydrochlorothiazide (HYDRODIURIL) 25 MG tablet Take 25 mg by mouth daily.   Yes [provider]  insulin glargine (LANTUS) 100 UNIT/ML injection Inject 15 Units into the skin at bedtime.   Yes [provider]  Insulin Pen Needle (PEN NEEDLES) 30G X 8 MM MISC 1 Device by Does not apply route once a week. 07/22/19  Yes McLean-Scocuzza, Karon Packer, MD  Lancets (ACCU-CHEK MULTICLIX) lancets Use as instructed check sugar 3x per day E11.9 07/11/17  Yes McLean-Scocuzza, Karon Packer, MD  lisinopril (ZESTRIL) 10 MG tablet Take 10 mg by mouth daily.   Yes [provider]  metFORMIN (GLUCOPHAGE) 500 MG tablet 1 pill qam with food lab appt further refills all meds no exceptions Patient taking differently: Take 1,000 mg by mouth 2 times daily  at 12 noon and 4 pm. 10/29/19  Yes McLean-Scocuzza, Karon Packer, MD  pseudoephedrine (SUDAFED) 120 MG 12 hr tablet Take 120 mg by mouth 2 (two) times daily as needed for congestion.   Yes [provider]    Allergies as of 08/30/2023 - Review Complete 08/15/2023  Allergen Reaction Noted   Jardiance [empagliflozin] Other (See Comments) 10/30/2019    Family History  Problem Relation Age of Onset   Alcohol abuse Father    Diabetes Father    Hypertension Father    Breast cancer Maternal Aunt    Breast cancer Paternal Aunt    Diabetes Maternal Grandmother    Colon cancer Maternal Grandmother    Glaucoma Mother    Cancer Maternal Grandfather        colon cancer in 38s     Social History   Socioeconomic History   Marital status: Single    Spouse name: Not on file   Number of children: Not on file   Years of education: Not on file   Highest education level: Not on file  Occupational History   Not on file  Tobacco Use   Smoking status: Never   Smokeless tobacco: Never  Vaping Use   Vaping status: Never Used  Substance and Sexual Activity   Alcohol use: Not Currently   Drug use: No   Sexual activity: Yes    Partners: Male  Other Topics Concern   Not on file  Social History Narrative   1 brother    Nieces and nephews    Social Drivers of Corporate investment banker Strain: Low Risk  (08/15/2023)   Received from [facility]   Overall Financial Resource Strain (CARDIA)    Difficulty of Paying Living Expenses: Not very hard  Food Insecurity: No Food Insecurity (08/15/2023)   [facility]   Hunger Vital Sign    Worried About Running Out of Food in the Last Year: Never true    Ran Out of Food in the Last Year: Never true  Transportation Needs: Unmet Transportation Needs (08/15/2023)   Received from [facility] - Transportation    In the past 12 months, has lack of transportation kept you from  medical appointments or from getting medications?: No    Lack of Transportation (Non-Medical): Yes  Physical Activity: Not on file  Stress: Not on file  Social Connections: Not on file  Intimate Partner Violence: Not on file    Review of Systems: See HPI, otherwise negative ROS  Physical Exam: BP (!) 144/97   Pulse (!) 105   Temp 97.8 F (36.6 C) (Temporal)   Ht 5\' 10"  (1.778 m)   Wt 111.6 kg   SpO2 99%   BMI 35.30 kg/m  General:   Alert, cooperative. Head:  Normocephalic and atraumatic. Respiratory:  Normal work of breathing. Cardiovascular:  NAD  Impression/Plan: Teresa Simpson is here for cataract surgery.  Risks, benefits, limitations, and alternatives regarding cataract surgery have been reviewed with the patient.  Questions have been answered.  All parties agreeable.   Clair Crews, MD  09/12/2023, 9:29 AM

## 2023-09-20 NOTE — Discharge Instructions (Signed)

## 2023-09-20 NOTE — Anesthesia Preprocedure Evaluation (Signed)
 Anesthesia Evaluation    Airway        Dental   Pulmonary           Cardiovascular hypertension,      Neuro/Psych    GI/Hepatic   Endo/Other  diabetes    Renal/GU      Musculoskeletal   Abdominal   Peds  Hematology   Anesthesia Other Findings Previous cataract surgery 09-12-23 Dr. Aldo Amble       Anesthesia Record 09/12/23 [facility] OR ROOM 02 / [facility]   PHACOEMULSIFICATION, CATARACT, WITH IOL INSERTION 7.06 00:47.6 (Left: Eye) Procedure Emilie Harden, MD Responsible Provider Clair Crews, MD [facility] Anesthesia Type 53 y.o. Age 5\' 10"  (1.778 m) Height 111.6 kg Weight  35.3 BMI 2200 NPO Status 3 ASA Status   Intraprocedure Grid/Graph    Anesthesia Timeline Intra-op Flowsheet Data 04/15/[id number] VitalsNIBPMAPECG Heart R...[id number] Net Volume:(L/min)O2(L/min)Aux O2(L/min)N2O(L/min)Air(Units)insulin  re...5 Units-(mg)midazolam  (VE...2 mg-(mcg)fentaNYL  (SU...100 mcg-Vent ModeResp(mmHg)ETCO2 mmHg.Aaron Aas.(%)FiO2 (%)(%)SpO2(mL)EBL0 mL0 mLEKGForced Air Warming.Aaron AasAaron AasFluid WarmerHead AssessmentMAPPulseVent Mode0 mL[8][8][8][0]400[0][0][0]0[0][0][0]5[2]5050Spontaneous...OFF28[21][17][18]32[36][44][45]75[92][79][75][100][100][100]0NSROFFOFFOFFOffEyes Checke...[id number][59][56][38]VFIEPPIRJJO...OFF  Preprocedure Note  Last edited 09/12/23 0950 by Emilie Harden, MD Date of Service 09/05/23 1120 Status: Addendum                                     Anesthesia Evaluation  Patient identified by MRN, date of birth, ID band Patient awake       Reviewed: Allergy & Precautions, H&P , NPO status , Patient's Chart, lab work & pertinent test results    Airway Mallampati: II   TM Distance: >3 FB Neck ROM: Full       Dental no notable dental hx.     Pulmonary neg pulmonary ROS   Pulmonary exam normal breath sounds clear to auscultation              Cardiovascular hypertension, Normal cardiovascular exam Rhythm:Regular Rate:Normal       Neuro/Psych negative neurological ROS  negative psych ROS    GI/Hepatic negative GI ROS, Neg liver ROS,,, Endo/Other  diabetes     Renal/GU negative Renal ROS  negative genitourinary   Musculoskeletal negative musculoskeletal ROS (+)     Abdominal   Peds negative pediatric ROS (+)  Hematology negative hematology ROS (+)  Anesthesia Other Findings First cataract surgery 08-15-23 Dr. Aldo Amble Second time canceled 04--01-25 Previously cancelled due to hyperglycemia, blood sugars 400 or more, previously had cataract surgery 09-12-23 Dr. Aldo Amble    Diabetes mellitus, poorly controlled Allergy Kidney stones  High blood pressure Asthma            Reproductive/Obstetrics                              Anesthesia Physical Anesthesia Plan Anesthesia Quick Evaluation

## 2023-09-26 ENCOUNTER — Encounter: Payer: Self-pay | Admitting: Anesthesiology

## 2023-09-26 ENCOUNTER — Ambulatory Visit: Admission: RE | Admit: 2023-09-26 | Source: Home / Self Care | Admitting: Ophthalmology

## 2023-09-26 SURGERY — PHACOEMULSIFICATION, CATARACT, WITH IOL INSERTION
Anesthesia: Topical | Laterality: Right
